# Patient Record
Sex: Male | Born: 1995 | Race: Black or African American | Hispanic: No | Marital: Single | Smoking: Current some day smoker
Health system: Southern US, Community
[De-identification: ages and names within clinical notes are randomized; demographics above are authoritative.]

## PROBLEM LIST (undated history)

## (undated) DIAGNOSIS — R569 Unspecified convulsions: Secondary | ICD-10-CM

## (undated) DIAGNOSIS — S82409A Unspecified fracture of shaft of unspecified fibula, initial encounter for closed fracture: Secondary | ICD-10-CM

## (undated) HISTORY — DX: Unspecified fracture of shaft of unspecified fibula, initial encounter for closed fracture: S82.409A

---
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
## 2016-08-13 DIAGNOSIS — J029 Acute pharyngitis, unspecified: Secondary | ICD-10-CM | POA: Diagnosis not present

## 2016-08-13 DIAGNOSIS — J309 Allergic rhinitis, unspecified: Secondary | ICD-10-CM | POA: Diagnosis not present

## 2018-10-10 DIAGNOSIS — H5213 Myopia, bilateral: Secondary | ICD-10-CM | POA: Diagnosis not present

## 2018-10-31 ENCOUNTER — Emergency Department (HOSPITAL_COMMUNITY)
Admission: EM | Admit: 2018-10-31 | Discharge: 2018-10-31 | Disposition: A | Discharge status: Home / Self Care | Payer: Managed Care, Other (non HMO) | Attending: Emergency Medicine | Admitting: Emergency Medicine

## 2018-10-31 ENCOUNTER — Other Ambulatory Visit: Payer: Self-pay

## 2018-10-31 DIAGNOSIS — R52 Pain, unspecified: Secondary | ICD-10-CM | POA: Diagnosis not present

## 2018-10-31 DIAGNOSIS — E876 Hypokalemia: Secondary | ICD-10-CM | POA: Insufficient documentation

## 2018-10-31 DIAGNOSIS — R1084 Generalized abdominal pain: Secondary | ICD-10-CM | POA: Diagnosis not present

## 2018-10-31 DIAGNOSIS — R1111 Vomiting without nausea: Secondary | ICD-10-CM | POA: Diagnosis not present

## 2018-10-31 DIAGNOSIS — Z7722 Contact with and (suspected) exposure to environmental tobacco smoke (acute) (chronic): Secondary | ICD-10-CM | POA: Diagnosis not present

## 2018-10-31 DIAGNOSIS — R112 Nausea with vomiting, unspecified: Secondary | ICD-10-CM | POA: Diagnosis not present

## 2018-10-31 DIAGNOSIS — R1033 Periumbilical pain: Secondary | ICD-10-CM | POA: Diagnosis not present

## 2018-10-31 LAB — CBC WITH DIFFERENTIAL/PLATELET
Abs Immature Granulocytes: 0.02 10*3/uL (ref 0.00–0.07)
Basophils Absolute: 0.1 10*3/uL (ref 0.0–0.1)
Basophils Relative: 1 %
Eosinophils Absolute: 0 10*3/uL (ref 0.0–0.5)
Eosinophils Relative: 0 %
HCT: 43.9 % (ref 39.0–52.0)
Hemoglobin: 14.9 g/dL (ref 13.0–17.0)
Immature Granulocytes: 0 %
Lymphocytes Relative: 9 %
Lymphs Abs: 1.1 10*3/uL (ref 0.7–4.0)
MCH: 30.8 pg (ref 26.0–34.0)
MCHC: 33.9 g/dL (ref 30.0–36.0)
MCV: 90.7 fL (ref 80.0–100.0)
Monocytes Absolute: 0.4 10*3/uL (ref 0.1–1.0)
Monocytes Relative: 4 %
Neutro Abs: 10.5 10*3/uL — ABNORMAL HIGH (ref 1.7–7.7)
Neutrophils Relative %: 86 %
Platelets: 256 10*3/uL (ref 150–400)
RBC: 4.84 MIL/uL (ref 4.22–5.81)
RDW: 11.6 % (ref 11.5–15.5)
WBC: 12.2 10*3/uL — ABNORMAL HIGH (ref 4.0–10.5)
nRBC: 0 % (ref 0.0–0.2)

## 2018-10-31 LAB — COMPREHENSIVE METABOLIC PANEL
ALT: 22 U/L (ref 0–44)
AST: 25 U/L (ref 15–41)
Albumin: 4.5 g/dL (ref 3.5–5.0)
Alkaline Phosphatase: 99 U/L (ref 38–126)
Anion gap: 14 (ref 5–15)
BUN: 12 mg/dL (ref 6–20)
CO2: 21 mmol/L — ABNORMAL LOW (ref 22–32)
Calcium: 9.8 mg/dL (ref 8.9–10.3)
Chloride: 103 mmol/L (ref 98–111)
Creatinine, Ser: 0.99 mg/dL (ref 0.61–1.24)
GFR calc Af Amer: >60 mL/min (ref >60)
GFR calc non Af Amer: >60 mL/min (ref >60)
Glucose, Bld: 126 mg/dL — ABNORMAL HIGH (ref 70–99)
Potassium: 2.9 mmol/L — ABNORMAL LOW (ref 3.5–5.1)
Sodium: 138 mmol/L (ref 135–145)
Total Bilirubin: 2 mg/dL — ABNORMAL HIGH (ref 0.3–1.2)
Total Protein: 7.3 g/dL (ref 6.5–8.1)

## 2018-10-31 LAB — LIPASE, BLOOD: Lipase: 22 U/L (ref 11–51)

## 2018-10-31 MED ORDER — POTASSIUM CHLORIDE CRYS ER 20 MEQ PO TBCR: 20.0000 meq | EXTENDED_RELEASE_TABLET | Freq: Two times a day (BID) | ORAL | 0 refills | Status: DC

## 2018-10-31 MED ORDER — LIDOCAINE VISCOUS HCL 2 % MT SOLN
15.0000 mL | Freq: Once | OROMUCOSAL | Status: AC
  Administered 2018-10-31: 04:00:00 15 mL via ORAL
  Filled 2018-10-31: qty 15

## 2018-10-31 MED ORDER — POTASSIUM CHLORIDE CRYS ER 20 MEQ PO TBCR
40.0000 meq | EXTENDED_RELEASE_TABLET | Freq: Once | ORAL | Status: AC
  Administered 2018-10-31: 40 meq via ORAL
  Filled 2018-10-31: qty 2

## 2018-10-31 MED ORDER — SODIUM CHLORIDE 0.9 % IV BOLUS
1000.0000 mL | Freq: Once | INTRAVENOUS | Status: AC
  Administered 2018-10-31: 04:00:00 1000 mL via INTRAVENOUS

## 2018-10-31 MED ORDER — ALUM & MAG HYDROXIDE-SIMETH 200-200-20 MG/5ML PO SUSP
30.0000 mL | Freq: Once | ORAL | Status: AC
  Administered 2018-10-31: 30 mL via ORAL
  Filled 2018-10-31: qty 30

## 2018-10-31 MED ORDER — MORPHINE SULFATE (PF) 4 MG/ML IV SOLN
4.0000 mg | Freq: Once | INTRAVENOUS | Status: AC
  Administered 2018-10-31: 04:00:00 4 mg via INTRAVENOUS
  Filled 2018-10-31: qty 1

## 2018-10-31 MED ORDER — ONDANSETRON HCL 4 MG PO TABS: 4.0000 mg | ORAL_TABLET | Freq: Four times a day (QID) | ORAL | 0 refills | Status: DC | PRN

## 2018-10-31 MED ORDER — ONDANSETRON HCL 4 MG/2ML IJ SOLN
4.0000 mg | Freq: Once | INTRAMUSCULAR | Status: AC
  Administered 2018-10-31: 4 mg via INTRAVENOUS
  Filled 2018-10-31: qty 2

## 2018-10-31 NOTE — ED Provider Notes (Signed)
[facility] EMERGENCY [facility] Provider Note   CSN: [id number] Arrival date & time: 10/31/18  0321    History   Chief Complaint Chief Complaint  Patient presents with  . Abdominal Pain    HPI Tom Jones is a 23 y.o. male.   The history is provided by the patient.  He had onset about 9 PM of severe periumbilical pain without radiation.  There is associated nausea and vomiting.  He denies constipation or diarrhea.  Pain started after eating some chicken Alfredo.  Pain is rated 10/10.  Nothing makes it better, nothing makes it worse.  He has not had similar symptoms in the past.  Past Medical History:  Diagnosis Date  . Closed fibular fracture 05/07/2012   right distal fib. - sledding accident  . Seizures age 69   febrile seizure x 1    Patient Active Problem List   Diagnosis Date Noted  . Closed fibular fracture   . Concussion without loss of consciousness 12/21/2010    Past Surgical History:  Procedure Laterality Date  . ORIF FIBULA FRACTURE Right 05/19/2012   Procedure: OPEN REDUCTION INTERNAL FIXATION (ORIF) FIBULA FRACTURE;  Surgeon: Nilda Simmerobert A Wainer, MD;  Location: [facility];  Service: Orthopedics;  Laterality: Right;        Home Medications    Prior to Admission medications   Medication Sig Start Date End Date Taking? Authorizing Provider  oxyCODONE-acetaminophen (ROXICET) 5-325 MG per tablet 1-2 tablets every 4-6 hrs as needed for pain 05/19/12   Shepperson, Kirstin, PA-C    Family History Family History  Problem Relation Age of Onset  . Diabetes Maternal Aunt        2 aunts  . Hypertension Maternal Aunt   . Heart disease Maternal Aunt        valve replacement  . Diabetes Maternal Grandmother     Social History Social History   Tobacco Use  . Smoking status: Passive Smoke Exposure - Never Smoker  . Smokeless tobacco: Never Used  . Tobacco comment: mother smokes outside  Substance Use Topics  . Alcohol use: No   . Drug use: No     Allergies   Patient has no known allergies.   Review of Systems Review of Systems  All other systems reviewed and are negative.    Physical Exam Updated Vital Signs BP 123/85   Pulse 74   Temp 98.6 F (37 C) (Oral)   Resp 20   SpO2 100%   Physical Exam Vitals signs and nursing note reviewed.    23 year old male, resting comfortably and in no acute distress. Vital signs are normal. Oxygen saturation is 100%, which is normal. Head is normocephalic and atraumatic. PERRLA, EOMI. Oropharynx is clear. Neck is nontender and supple without adenopathy or JVD. Back is nontender and there is no CVA tenderness. Lungs are clear without rales, wheezes, or rhonchi. Chest is nontender. Heart has regular rate and rhythm without murmur. Abdomen is soft, flat, with moderate periumbilical tenderness.  There is no rebound or guarding.  There are no masses or hepatosplenomegaly and peristalsis is hypoactive. Extremities have no cyanosis or edema, full range of motion is present. Skin is warm and dry without rash. Neurologic: Mental status is normal, cranial nerves are intact, there are no motor or sensory deficits.  ED Treatments / Results  Labs (all labs ordered are listed, but only abnormal results are displayed) Labs Reviewed  COMPREHENSIVE METABOLIC PANEL - Abnormal; Notable for  the following components:      Result Value   Potassium 2.9 (*)    CO2 21 (*)    Glucose, Bld 126 (*)    Total Bilirubin 2.0 (*)    All other components within normal limits  CBC WITH DIFFERENTIAL/PLATELET - Abnormal; Notable for the following components:   WBC 12.2 (*)    Neutro Abs 10.5 (*)    All other components within normal limits  LIPASE, BLOOD  URINALYSIS, ROUTINE W REFLEX MICROSCOPIC   Procedures Procedures   Medications Ordered in ED Medications  sodium chloride 0.9 % bolus 1,000 mL (has no administration in time range)  ondansetron (ZOFRAN) injection 4 mg (has no  administration in time range)  morphine 4 MG/ML injection 4 mg (has no administration in time range)  alum & mag hydroxide-simeth (MAALOX/MYLANTA) 200-200-20 MG/5ML suspension 30 mL (has no administration in time range)    And  lidocaine (XYLOCAINE) 2 % viscous mouth solution 15 mL (has no administration in time range)     Initial Impression / Assessment and Plan / ED Course  I have reviewed the triage vital signs and the nursing notes.  Pertinent labs & imaging results that were available during my care of the patient were reviewed by me and considered in my medical decision making (see chart for details).  Abdominal pain with rather benign abdominal exam.  Doubt serious pathology.  Suspect viral gastritis.  No right upper quadrant pain or Murphy sign to suggest biliary tract disease.  Will check screening labs, give IV fluids, morphine, ondansetron, GI cocktail.  Old records are reviewed, and he has no relevant past visits.  He feels much better after above-noted treatment.  Labs are significant for hypokalemia, presumably secondary to vomiting.  He is given a dose of oral potassium and is discharged with prescriptions for K. Dur and ondansetron.  Return precautions discussed.  Final Clinical Impressions(s) / ED Diagnoses   Final diagnoses:  Periumbilical pain  Non-intractable vomiting with nausea, unspecified vomiting type  Hypokalemia    ED Discharge Orders         Ordered    potassium chloride SA (K-DUR) 20 MEQ tablet  2 times daily     10/31/18 0712    ondansetron (ZOFRAN) 4 MG tablet  Every 6 hours PRN     10/31/18 6010           Delora Fuel, MD 93/23/55 [phone]

## 2018-10-31 NOTE — ED Triage Notes (Signed)
Patient c/o abd pain that began last night about 9pm.. Also c/o n/v and is moaning while triaging

## 2018-10-31 NOTE — Discharge Instructions (Addendum)
Take antacids as needed.  Return if symptoms are getting worse. 

## 2018-10-31 NOTE — ED Notes (Signed)
Pt discharge summary reviewed with pt. Pt verbalized understanding of instructions. Pt unable to sign d/t signature pad not picking up. Pt discharged.

## 2018-10-31 NOTE — ED Notes (Signed)
Pt made aware that we need urine specimen. 

## 2018-11-01 ENCOUNTER — Encounter (HOSPITAL_COMMUNITY): Payer: Self-pay | Admitting: Emergency Medicine

## 2018-11-01 ENCOUNTER — Other Ambulatory Visit: Payer: Self-pay

## 2018-11-01 ENCOUNTER — Emergency Department (HOSPITAL_COMMUNITY)
Admission: EM | Admit: 2018-11-01 | Discharge: 2018-11-01 | Disposition: A | Discharge status: Home / Self Care | Payer: Managed Care, Other (non HMO) | Attending: Emergency Medicine | Admitting: Emergency Medicine

## 2018-11-01 ENCOUNTER — Emergency Department (HOSPITAL_COMMUNITY): Payer: Managed Care, Other (non HMO)

## 2018-11-01 DIAGNOSIS — Z7722 Contact with and (suspected) exposure to environmental tobacco smoke (acute) (chronic): Secondary | ICD-10-CM | POA: Insufficient documentation

## 2018-11-01 DIAGNOSIS — R112 Nausea with vomiting, unspecified: Secondary | ICD-10-CM | POA: Insufficient documentation

## 2018-11-01 DIAGNOSIS — R109 Unspecified abdominal pain: Secondary | ICD-10-CM | POA: Diagnosis present

## 2018-11-01 DIAGNOSIS — R1110 Vomiting, unspecified: Secondary | ICD-10-CM | POA: Diagnosis not present

## 2018-11-01 LAB — URINALYSIS, ROUTINE W REFLEX MICROSCOPIC
Bilirubin Urine: NEGATIVE
Glucose, UA: NEGATIVE mg/dL
Hgb urine dipstick: NEGATIVE
Ketones, ur: 20 mg/dL — AB
Leukocytes,Ua: NEGATIVE
Nitrite: NEGATIVE
Protein, ur: NEGATIVE mg/dL
Specific Gravity, Urine: >1.046 — ABNORMAL HIGH (ref 1.005–1.030)
pH: 7 (ref 5.0–8.0)

## 2018-11-01 LAB — COMPREHENSIVE METABOLIC PANEL
ALT: 21 U/L (ref 0–44)
AST: 18 U/L (ref 15–41)
Albumin: 4.5 g/dL (ref 3.5–5.0)
Alkaline Phosphatase: 94 U/L (ref 38–126)
Anion gap: 12 (ref 5–15)
BUN: 9 mg/dL (ref 6–20)
CO2: 24 mmol/L (ref 22–32)
Calcium: 9.8 mg/dL (ref 8.9–10.3)
Chloride: 102 mmol/L (ref 98–111)
Creatinine, Ser: 0.97 mg/dL (ref 0.61–1.24)
GFR calc Af Amer: >60 mL/min (ref >60)
GFR calc non Af Amer: >60 mL/min (ref >60)
Glucose, Bld: 117 mg/dL — ABNORMAL HIGH (ref 70–99)
Potassium: 3.9 mmol/L (ref 3.5–5.1)
Sodium: 138 mmol/L (ref 135–145)
Total Bilirubin: 2.6 mg/dL — ABNORMAL HIGH (ref 0.3–1.2)
Total Protein: 7.7 g/dL (ref 6.5–8.1)

## 2018-11-01 LAB — CBC
HCT: 45.2 % (ref 39.0–52.0)
Hemoglobin: 15.1 g/dL (ref 13.0–17.0)
MCH: 30.6 pg (ref 26.0–34.0)
MCHC: 33.4 g/dL (ref 30.0–36.0)
MCV: 91.5 fL (ref 80.0–100.0)
Platelets: 250 10*3/uL (ref 150–400)
RBC: 4.94 MIL/uL (ref 4.22–5.81)
RDW: 11.9 % (ref 11.5–15.5)
WBC: 13.5 10*3/uL — ABNORMAL HIGH (ref 4.0–10.5)
nRBC: 0 % (ref 0.0–0.2)

## 2018-11-01 LAB — LACTIC ACID, PLASMA: Lactic Acid, Venous: 1.3 mmol/L (ref 0.5–1.9)

## 2018-11-01 LAB — LIPASE, BLOOD: Lipase: 42 U/L (ref 11–51)

## 2018-11-01 MED ORDER — ONDANSETRON 4 MG PO TBDP: 4.0000 mg | ORAL_TABLET | Freq: Three times a day (TID) | ORAL | 0 refills | Status: DC | PRN

## 2018-11-01 MED ORDER — METOCLOPRAMIDE HCL 5 MG/ML IJ SOLN
10.0000 mg | Freq: Once | INTRAMUSCULAR | Status: AC
  Administered 2018-11-01: 10 mg via INTRAVENOUS
  Filled 2018-11-01: qty 2

## 2018-11-01 MED ORDER — SODIUM CHLORIDE 0.9% FLUSH
3.0000 mL | Freq: Once | INTRAVENOUS | Status: AC
  Administered 2018-11-01: 3 mL via INTRAVENOUS

## 2018-11-01 MED ORDER — HALOPERIDOL LACTATE 5 MG/ML IJ SOLN
5.0000 mg | Freq: Once | INTRAMUSCULAR | Status: AC
  Administered 2018-11-01: 5 mg via INTRAVENOUS
  Filled 2018-11-01: qty 1

## 2018-11-01 MED ORDER — IOHEXOL 300 MG/ML  SOLN
100.0000 mL | Freq: Once | INTRAMUSCULAR | Status: AC | PRN
  Administered 2018-11-01: 100 mL via INTRAVENOUS

## 2018-11-01 MED ORDER — ONDANSETRON 4 MG PO TBDP: 4.0000 mg | ORAL_TABLET | Freq: Once | ORAL | Status: DC | PRN

## 2018-11-01 MED ORDER — MORPHINE SULFATE (PF) 4 MG/ML IV SOLN
4.0000 mg | Freq: Once | INTRAVENOUS | Status: AC
  Administered 2018-11-01: 4 mg via INTRAVENOUS
  Filled 2018-11-01: qty 1

## 2018-11-01 MED ORDER — ONDANSETRON HCL 4 MG/2ML IJ SOLN
4.0000 mg | Freq: Once | INTRAMUSCULAR | Status: AC
  Administered 2018-11-01: 4 mg via INTRAVENOUS
  Filled 2018-11-01: qty 2

## 2018-11-01 MED ORDER — LACTATED RINGERS IV BOLUS
1000.0000 mL | Freq: Once | INTRAVENOUS | Status: AC
  Administered 2018-11-01: 1000 mL via INTRAVENOUS

## 2018-11-01 NOTE — ED Notes (Signed)
Pt is doing better according to the pts mother  hes sleeping

## 2018-11-01 NOTE — ED Notes (Signed)
Pt provided with urinal, attempted to void, however unable to at this time.  IV fluid bolus administered, will re-attempt  to obtain urine sample when fluids are completed.

## 2018-11-01 NOTE — ED Notes (Signed)
Patient transported to CT 

## 2018-11-01 NOTE — ED Provider Notes (Signed)
[facility] EMERGENCY DEPARTMENT Provider Note   CSN: [id number] Arrival date & time: 11/01/18  [id number]     History   Chief Complaint Chief Complaint  Patient presents with  . Emesis    HPI Tom Jones is a 23 y.o. male.     Patient is a 23 year old healthy male with no significant medical problems returning today for persistent abdominal pain and vomiting.  Patient was seen yesterday for similar symptoms and had labs that showed a leukocytosis of 12,000 and mild hypokalemia.  He was feeling better after morphine and GI cocktail and was discharged home with Zofran and potassium.  However throughout the night patient has continued to vomit and mom states now the emesis appears to be green.  He is still not able to hold anything down and rates his pain is 9 out of 10 throughout the entire abdomen.  He states trying to take the medications makes the pain worse.  He denies any NSAID use, alcohol use, drug use.  He takes no medications regularly.  He denies any urinary symptoms, cough, shortness of breath or fever.  The history is provided by the patient.  Emesis Severity:  Moderate Duration:  2 days Timing:  Constant Number of daily episodes:  Multiple Quality:  Bilious material and stomach contents Progression:  Worsening Chronicity:  New Recent urination:  Decreased Relieved by:  Nothing Worsened by:  Antiemetics and food smell Ineffective treatments:  Antiemetics Associated symptoms: abdominal pain   Associated symptoms: no diarrhea, no fever, no sore throat and no URI   Risk factors: no alcohol use, no prior abdominal surgery, no sick contacts, no suspect food intake and no travel to endemic areas     Past Medical History:  Diagnosis Date  . Closed fibular fracture 05/07/2012   right distal fib. - sledding accident  . Seizures [facility](HCC) age 51   febrile seizure x 1    Patient Active Problem List   Diagnosis Date Noted  . Closed fibular fracture   .  Concussion without loss of consciousness 12/21/2010    Past Surgical History:  Procedure Laterality Date  . ORIF FIBULA FRACTURE Right 05/19/2012   Procedure: OPEN REDUCTION INTERNAL FIXATION (ORIF) FIBULA FRACTURE;  Surgeon: Nilda Simmerobert A Wainer, MD;  Location: [facility];  Service: Orthopedics;  Laterality: Right;        Home Medications    Prior to Admission medications   Medication Sig Start Date End Date Taking? Authorizing Provider  ondansetron (ZOFRAN) 4 MG tablet Take 1 tablet (4 mg total) by mouth every 6 (six) hours as needed for nausea or vomiting. 10/31/18   Dione BoozeGlick, David, MD  potassium chloride SA (K-DUR) 20 MEQ tablet Take 1 tablet (20 mEq total) by mouth 2 (two) times daily. 10/31/18   Dione BoozeGlick, David, MD    Family History Family History  Problem Relation Age of Onset  . Diabetes Maternal Aunt        2 aunts  . Hypertension Maternal Aunt   . Heart disease Maternal Aunt        valve replacement  . Diabetes Maternal Grandmother     Social History Social History   Tobacco Use  . Smoking status: Passive Smoke Exposure - Never Smoker  . Smokeless tobacco: Never Used  . Tobacco comment: mother smokes outside  Substance Use Topics  . Alcohol use: No  . Drug use: No     Allergies   Patient has no known allergies.  Review of Systems Review of Systems  Constitutional: Negative for fever.  HENT: Negative for sore throat.   Gastrointestinal: Positive for abdominal pain and vomiting. Negative for diarrhea.  All other systems reviewed and are negative.    Physical Exam Updated Vital Signs BP (!) 143/92 (BP Location: Right Arm)   Pulse 64   Temp 99 F (37.2 C) (Oral)   Resp 20   SpO2 100%   Physical Exam Vitals signs and nursing note reviewed.  Constitutional:      General: He is in acute distress.     Appearance: He is well-developed and normal weight.     Comments: Appears uncomfortable  HENT:     Head: Normocephalic and atraumatic.      Nose: Nose normal.     Mouth/Throat:     Mouth: Mucous membranes are dry.  Eyes:     Conjunctiva/sclera: Conjunctivae normal.     Pupils: Pupils are equal, round, and reactive to light.  Neck:     Musculoskeletal: Normal range of motion and neck supple.  Cardiovascular:     Rate and Rhythm: Normal rate and regular rhythm.     Pulses: Normal pulses.     Heart sounds: No murmur.  Pulmonary:     Effort: Pulmonary effort is normal. No respiratory distress.     Breath sounds: Normal breath sounds. No wheezing or rales.  Abdominal:     General: Abdomen is flat. There is no distension.     Palpations: Abdomen is soft.     Tenderness: There is abdominal tenderness. There is guarding. There is no rebound.     Comments: Generalized abdominal pain but more pronounced in the right lower quadrant  Musculoskeletal: Normal range of motion.        General: No tenderness.  Skin:    General: Skin is warm and dry.     Findings: No erythema or rash.  Neurological:     General: No focal deficit present.     Mental Status: He is alert and oriented to person, place, and time. Mental status is at baseline.  Psychiatric:        Mood and Affect: Mood normal.        Behavior: Behavior normal.        Thought Content: Thought content normal.      ED Treatments / Results  Labs (all labs ordered are listed, but only abnormal results are displayed) Labs Reviewed  COMPREHENSIVE METABOLIC PANEL - Abnormal; Notable for the following components:      Result Value   Glucose, Bld 117 (*)    Total Bilirubin 2.6 (*)    All other components within normal limits  CBC - Abnormal; Notable for the following components:   WBC 13.5 (*)    All other components within normal limits  LIPASE, BLOOD  LACTIC ACID, PLASMA  URINALYSIS, ROUTINE W REFLEX MICROSCOPIC    EKG EKG Interpretation  Date/Time:  Saturday November 01 2018 11:44:38 EDT Ventricular Rate:  47 PR Interval:    QRS Duration: 101 QT Interval:  451  QTC Calculation: 399 R Axis:   97 Text Interpretation:  Age not entered, assumed to be  23 years old for purpose of ECG interpretation Sinus bradycardia Left ventricular hypertrophy Inferior infarct, acute (LCx) ST elevation, consider anterior injury Lateral leads are also involved Baseline wander in lead(s) V4 V5 Anterolateral ST elevation, probable early repolarization Confirmed by Blanchie Dessert [phone]) on 11/01/2018 12:21:41 PM   Radiology Ct Abdomen Pelvis W  Contrast  Result Date: 11/01/2018 CLINICAL DATA:  23 year old male with acute abdominal and pelvic pain with nausea and vomiting today. EXAM: CT ABDOMEN AND PELVIS WITH CONTRAST TECHNIQUE: Multidetector CT imaging of the abdomen and pelvis was performed using the standard protocol following bolus administration of intravenous contrast. CONTRAST:  100mL OMNIPAQUE IOHEXOL 300 MG/ML  SOLN COMPARISON:  None. FINDINGS: Lower chest: Unremarkable. Hepatobiliary: No significant hepatic or gallbladder abnormalities. No biliary dilatation. Pancreas: Unremarkable Spleen: Unremarkable Adrenals/Urinary Tract: The kidneys, adrenal glands and bladder are unremarkable. Stomach/Bowel: High density material within the RIGHT colon and stomach noted-question medications versus less likely ingested non food foreign material. No bowel dilatation, evidence of bowel obstruction, bowel wall thickening or inflammation noted. The appendix is normal. Vascular/Lymphatic: No significant vascular findings are present. No enlarged abdominal or pelvic lymph nodes. Reproductive: Prostate is unremarkable. Other: No ascites, focal collection or pneumoperitoneum. No abdominal wall hernia. Musculoskeletal: No acute or suspicious bony abnormalities. IMPRESSION: 1. No evidence of acute abnormality. 2. High density material within the RIGHT colon and stomach-favor medications versus less likely ingested non food foreign material. Correlate clinically. Electronically Signed   By: Harmon PierJeffrey   Hu M.D.   On: 11/01/2018 11:48    Procedures Procedures (including critical care time)  Medications Ordered in ED Medications  sodium chloride flush (NS) 0.9 % injection 3 mL (has no administration in time range)  lactated ringers bolus 1,000 mL (has no administration in time range)  ondansetron (ZOFRAN) injection 4 mg (has no administration in time range)  morphine 4 MG/ML injection 4 mg (has no administration in time range)     Initial Impression / Assessment and Plan / ED Course  I have reviewed the triage vital signs and the nursing notes.  Pertinent labs & imaging results that were available during my care of the patient were reviewed by me and considered in my medical decision making (see chart for details).       Patient returning today with persistent vomiting and diffuse abdominal pain.  Patient seen yesterday and discharged with similar symptoms but feeling better after supportive care.  Patient has diffuse abdominal pain on my exam with guarding but more severe in the right lower quadrant.  Patient had mild leukocytosis of 12 yesterday and CMP with mild hypokalemia.  Patient has continued to vomit throughout the night.  Patient given nausea and pain control.  Repeat labs and CT pending.  Currently hemodynamically stable.  Concern for appendicitis versus obstruction versus colitis versus perforation.  3:21 PM Patient has persistent mild leukocytosis of 13,000, CMP without acute findings and potassium is now normal.  Lipase is still within normal limits.  CT shows no evidence of acute abnormality but possible medication versus non-food foreign material in the colon without evidence of obstruction.  On repeat evaluation patient is still having discomfort and vomiting.  Speaking with the patient's mother she states he does smoke marijuana regularly.  Concern for possible hyperemesis syndrome from cannabis.  Patient will be given Haldol as Reglan and Zofran have not been helpful.   Final Clinical Impressions(s) / ED Diagnoses   Final diagnoses:  None    ED Discharge Orders    None       Gwyneth SproutPlunkett, Huntley Demedeiros, MD 11/01/18 [phone]

## 2018-11-01 NOTE — Discharge Instructions (Signed)
Not smoking marijuana may help with the vomiting.  Try and keep yourself hydrated.

## 2018-11-01 NOTE — ED Triage Notes (Addendum)
Pt reports continued abd pain with n/v, denies diarrhea, fever, chills.

## 2018-11-02 ENCOUNTER — Other Ambulatory Visit: Payer: Self-pay

## 2018-11-02 ENCOUNTER — Encounter (HOSPITAL_COMMUNITY): Payer: Self-pay | Admitting: Emergency Medicine

## 2018-11-02 ENCOUNTER — Emergency Department (HOSPITAL_COMMUNITY)
Admission: EM | Admit: 2018-11-02 | Discharge: 2018-11-02 | Discharge status: Against Medical Advice | Payer: Managed Care, Other (non HMO) | Attending: Emergency Medicine | Admitting: Emergency Medicine

## 2018-11-02 DIAGNOSIS — R109 Unspecified abdominal pain: Secondary | ICD-10-CM | POA: Diagnosis not present

## 2018-11-02 DIAGNOSIS — Z5321 Procedure and treatment not carried out due to patient leaving prior to being seen by health care provider: Secondary | ICD-10-CM | POA: Diagnosis not present

## 2018-11-02 DIAGNOSIS — R112 Nausea with vomiting, unspecified: Secondary | ICD-10-CM | POA: Diagnosis present

## 2018-11-02 LAB — CBC
HCT: 45.3 % (ref 39.0–52.0)
Hemoglobin: 15 g/dL (ref 13.0–17.0)
MCH: 30.7 pg (ref 26.0–34.0)
MCHC: 33.1 g/dL (ref 30.0–36.0)
MCV: 92.8 fL (ref 80.0–100.0)
Platelets: 249 10*3/uL (ref 150–400)
RBC: 4.88 MIL/uL (ref 4.22–5.81)
RDW: 11.7 % (ref 11.5–15.5)
WBC: 11.9 10*3/uL — ABNORMAL HIGH (ref 4.0–10.5)
nRBC: 0 % (ref 0.0–0.2)

## 2018-11-02 LAB — COMPREHENSIVE METABOLIC PANEL
ALT: 22 U/L (ref 0–44)
AST: 19 U/L (ref 15–41)
Albumin: 4.3 g/dL (ref 3.5–5.0)
Alkaline Phosphatase: 97 U/L (ref 38–126)
Anion gap: 13 (ref 5–15)
BUN: 10 mg/dL (ref 6–20)
CO2: 24 mmol/L (ref 22–32)
Calcium: 9.4 mg/dL (ref 8.9–10.3)
Chloride: 98 mmol/L (ref 98–111)
Creatinine, Ser: 0.89 mg/dL (ref 0.61–1.24)
GFR calc Af Amer: >60 mL/min (ref >60)
GFR calc non Af Amer: >60 mL/min (ref >60)
Glucose, Bld: 107 mg/dL — ABNORMAL HIGH (ref 70–99)
Potassium: 3.6 mmol/L (ref 3.5–5.1)
Sodium: 135 mmol/L (ref 135–145)
Total Bilirubin: 3.8 mg/dL — ABNORMAL HIGH (ref 0.3–1.2)
Total Protein: 7.3 g/dL (ref 6.5–8.1)

## 2018-11-02 LAB — LIPASE, BLOOD: Lipase: 38 U/L (ref 11–51)

## 2018-11-02 MED ORDER — SODIUM CHLORIDE 0.9% FLUSH: 3.0000 mL | Freq: Once | INTRAVENOUS | Status: DC

## 2018-11-02 NOTE — ED Notes (Signed)
NO ANSWER X 3 in lobby

## 2018-11-02 NOTE — ED Notes (Signed)
Unable to find pt in waiting room or triage rooms.

## 2018-11-02 NOTE — ED Triage Notes (Signed)
Pt c/o nausea/vomiting/abdominal pain x 3 days. Seen yesterday for the same.

## 2018-11-04 ENCOUNTER — Ambulatory Visit (INDEPENDENT_AMBULATORY_CARE_PROVIDER_SITE_OTHER): Payer: Managed Care, Other (non HMO) | Admitting: Nurse Practitioner

## 2018-11-04 ENCOUNTER — Encounter: Payer: Self-pay | Admitting: Nurse Practitioner

## 2018-11-04 ENCOUNTER — Other Ambulatory Visit: Payer: Self-pay

## 2018-11-04 VITALS — BP 138/98 | HR 98 | Temp 98.4°F | Ht 67.0 in | Wt 144.0 lb

## 2018-11-04 DIAGNOSIS — F1290 Cannabis use, unspecified, uncomplicated: Secondary | ICD-10-CM | POA: Diagnosis not present

## 2018-11-04 DIAGNOSIS — R1110 Vomiting, unspecified: Secondary | ICD-10-CM | POA: Insufficient documentation

## 2018-11-04 DIAGNOSIS — Z87898 Personal history of other specified conditions: Secondary | ICD-10-CM | POA: Insufficient documentation

## 2018-11-04 DIAGNOSIS — F1291 Cannabis use, unspecified, in remission: Secondary | ICD-10-CM | POA: Insufficient documentation

## 2018-11-04 DIAGNOSIS — R112 Nausea with vomiting, unspecified: Secondary | ICD-10-CM | POA: Diagnosis not present

## 2018-11-04 MED ORDER — PROMETHAZINE HCL 25 MG/ML IJ SOLN
25.0000 mg | Freq: Once | INTRAMUSCULAR | Status: AC
  Administered 2018-11-04: 25 mg via INTRAMUSCULAR

## 2018-11-04 MED ORDER — AMITRIPTYLINE HCL 25 MG PO TABS: 25.0000 mg | ORAL_TABLET | Freq: Every day | ORAL | 1 refills | Status: DC

## 2018-11-04 MED ORDER — PROMETHAZINE HCL 25 MG PO TABS: 25.0000 mg | ORAL_TABLET | Freq: Once | ORAL | Status: DC

## 2018-11-04 NOTE — Patient Instructions (Signed)
Get capsacin cream to rub on your stomach as needed for pain

## 2018-11-04 NOTE — Progress Notes (Signed)
Subjective:     Patient ID: Tom Jones , male    DOB: 01/23/1996 , 23 y.o.   MRN: [id number]   Chief Complaint  Patient presents with  . Abdominal Pain    patient stated his stomach has been hurting and he has been vomitting. patient denies if he has had a fever     HPI  Smokes marijuana daily at least two times a day.  It has been 6 days.  Ordered pasta and cereal then vomited.  He has vomited 12 times in the last 24 hours. Has tried to eat fruits and liquids.  Abdominal pain is "severe".     Abdominal Pain This is a new problem. The current episode started more than 1 year ago. The onset quality is sudden. Pertinent negatives include no dysuria.     Past Medical History:  Diagnosis Date  . Closed fibular fracture 05/07/2012   right distal fib. - sledding accident  . Seizures [facility](HCC) age 6   febrile seizure x 1     Family History  Problem Relation Age of Onset  . Diabetes Maternal Aunt        2 aunts  . Hypertension Maternal Aunt   . Heart disease Maternal Aunt        valve replacement  . Diabetes Maternal Grandmother      Current Outpatient Medications:  .  ondansetron (ZOFRAN) 4 MG tablet, Take 1 tablet (4 mg total) by mouth every 6 (six) hours as needed for nausea or vomiting., Disp: 12 tablet, Rfl: 0 .  potassium chloride SA (K-DUR) 20 MEQ tablet, Take 1 tablet (20 mEq total) by mouth 2 (two) times daily., Disp: 10 tablet, Rfl: 0   No Known Allergies   Review of Systems  Constitutional: Negative.   Respiratory: Negative.   Cardiovascular: Negative.  Negative for chest pain, palpitations and leg swelling.  Gastrointestinal: Positive for abdominal pain.  Genitourinary: Negative for decreased urine volume and dysuria.  Psychiatric/Behavioral: Negative.     Today's Vitals   11/04/18 1213  BP: (!) 138/98  Pulse: 98  Temp: 98.4 F (36.9 C)  TempSrc: Oral  Weight: 144 lb (65.3 kg)  Height: 5\' 7"  (1.702 m)  PainSc: 7   PainLoc: Abdomen   Body mass index is  22.55 kg/m.   Objective:  Physical Exam Abdominal:     General: Abdomen is flat. Bowel sounds are normal. There is no distension or abdominal bruit.     Palpations: Abdomen is soft.     Tenderness: There is generalized abdominal tenderness (mild tenderness).  Skin:    General: Skin is warm and dry.  Neurological:     General: No focal deficit present.     Mental Status: He is alert.  Psychiatric:        Mood and Affect: Mood normal.        Behavior: Behavior normal.         Assessment And Plan:     1. Intractable vomiting with nausea, unspecified vomiting type  He has continued to have intermittent vomiting feeling a little better today  This is thought to be related to his frequent use of marijuana twice a day  Will treat with promethazine in office to see if effective  He is to return call to office if he is not better  He is also advised to continue drinking clear liquids  CT scan was mostly benign with indication of high density material within the right colon favoring medications  -  amitriptyline (ELAVIL) 25 MG tablet; Take 1 tablet (25 mg total) by mouth at bedtime.  Dispense: 30 tablet; Refill: 1 - promethazine (PHENERGAN) injection 25 mg  2. History of marijuana use  Frequent use two times a day  It is felt his hyperemesis is related to overuse with marijuana.  - promethazine (PHENERGAN) injection 25 mg   Minette Brine, FNP    THE PATIENT IS ENCOURAGED TO PRACTICE SOCIAL DISTANCING DUE TO THE COVID-19 PANDEMIC.

## 2018-11-12 ENCOUNTER — Emergency Department (HOSPITAL_COMMUNITY): Payer: Managed Care, Other (non HMO)

## 2018-11-12 ENCOUNTER — Encounter (HOSPITAL_COMMUNITY): Payer: Self-pay | Admitting: Emergency Medicine

## 2018-11-12 ENCOUNTER — Emergency Department (HOSPITAL_COMMUNITY)
Admission: EM | Admit: 2018-11-12 | Discharge: 2018-11-12 | Disposition: A | Discharge status: Home / Self Care | Payer: Managed Care, Other (non HMO) | Attending: Emergency Medicine | Admitting: Emergency Medicine

## 2018-11-12 ENCOUNTER — Encounter: Payer: Self-pay | Admitting: Physician Assistant

## 2018-11-12 ENCOUNTER — Other Ambulatory Visit: Payer: Self-pay

## 2018-11-12 DIAGNOSIS — Z7722 Contact with and (suspected) exposure to environmental tobacco smoke (acute) (chronic): Secondary | ICD-10-CM | POA: Insufficient documentation

## 2018-11-12 DIAGNOSIS — R109 Unspecified abdominal pain: Secondary | ICD-10-CM

## 2018-11-12 DIAGNOSIS — R1115 Cyclical vomiting syndrome unrelated to migraine: Secondary | ICD-10-CM

## 2018-11-12 DIAGNOSIS — Z79899 Other long term (current) drug therapy: Secondary | ICD-10-CM | POA: Insufficient documentation

## 2018-11-12 DIAGNOSIS — R112 Nausea with vomiting, unspecified: Secondary | ICD-10-CM | POA: Diagnosis present

## 2018-11-12 DIAGNOSIS — G43A1 Cyclical vomiting, intractable: Secondary | ICD-10-CM | POA: Diagnosis not present

## 2018-11-12 DIAGNOSIS — R1084 Generalized abdominal pain: Secondary | ICD-10-CM | POA: Insufficient documentation

## 2018-11-12 DIAGNOSIS — R1110 Vomiting, unspecified: Secondary | ICD-10-CM | POA: Diagnosis not present

## 2018-11-12 LAB — CBC WITH DIFFERENTIAL/PLATELET
Abs Immature Granulocytes: 0.02 10*3/uL (ref 0.00–0.07)
Basophils Absolute: 0.1 10*3/uL (ref 0.0–0.1)
Basophils Relative: 2 %
Eosinophils Absolute: 0 10*3/uL (ref 0.0–0.5)
Eosinophils Relative: 0 %
HCT: 41.1 % (ref 39.0–52.0)
Hemoglobin: 14.4 g/dL (ref 13.0–17.0)
Immature Granulocytes: 0 %
Lymphocytes Relative: 17 %
Lymphs Abs: 1.6 10*3/uL (ref 0.7–4.0)
MCH: 31 pg (ref 26.0–34.0)
MCHC: 35 g/dL (ref 30.0–36.0)
MCV: 88.4 fL (ref 80.0–100.0)
Monocytes Absolute: 0.6 10*3/uL (ref 0.1–1.0)
Monocytes Relative: 6 %
Neutro Abs: 7.3 10*3/uL (ref 1.7–7.7)
Neutrophils Relative %: 75 %
Platelets: 279 10*3/uL (ref 150–400)
RBC: 4.65 MIL/uL (ref 4.22–5.81)
RDW: 11.4 % — ABNORMAL LOW (ref 11.5–15.5)
WBC: 9.7 10*3/uL (ref 4.0–10.5)
nRBC: 0 % (ref 0.0–0.2)

## 2018-11-12 LAB — COMPREHENSIVE METABOLIC PANEL
ALT: 37 U/L (ref 0–44)
AST: 35 U/L (ref 15–41)
Albumin: 4 g/dL (ref 3.5–5.0)
Alkaline Phosphatase: 80 U/L (ref 38–126)
Anion gap: 10 (ref 5–15)
BUN: 9 mg/dL (ref 6–20)
CO2: 26 mmol/L (ref 22–32)
Calcium: 9.5 mg/dL (ref 8.9–10.3)
Chloride: 101 mmol/L (ref 98–111)
Creatinine, Ser: 0.94 mg/dL (ref 0.61–1.24)
GFR calc Af Amer: >60 mL/min (ref >60)
GFR calc non Af Amer: >60 mL/min (ref >60)
Glucose, Bld: 118 mg/dL — ABNORMAL HIGH (ref 70–99)
Potassium: 3.5 mmol/L (ref 3.5–5.1)
Sodium: 137 mmol/L (ref 135–145)
Total Bilirubin: 2.3 mg/dL — ABNORMAL HIGH (ref 0.3–1.2)
Total Protein: 6.7 g/dL (ref 6.5–8.1)

## 2018-11-12 LAB — URINALYSIS, ROUTINE W REFLEX MICROSCOPIC
Bilirubin Urine: NEGATIVE
Glucose, UA: NEGATIVE mg/dL
Hgb urine dipstick: NEGATIVE
Ketones, ur: 20 mg/dL — AB
Leukocytes,Ua: NEGATIVE
Nitrite: NEGATIVE
Protein, ur: NEGATIVE mg/dL
Specific Gravity, Urine: >1.046 — ABNORMAL HIGH (ref 1.005–1.030)
pH: 8 (ref 5.0–8.0)

## 2018-11-12 LAB — RAPID URINE DRUG SCREEN, HOSP PERFORMED
Amphetamines: NOT DETECTED
Barbiturates: NOT DETECTED
Benzodiazepines: NOT DETECTED
Cocaine: NOT DETECTED
Opiates: POSITIVE — AB
Tetrahydrocannabinol: POSITIVE — AB

## 2018-11-12 LAB — LACTIC ACID, PLASMA: Lactic Acid, Venous: 1.6 mmol/L (ref 0.5–1.9)

## 2018-11-12 MED ORDER — SODIUM CHLORIDE 0.9 % IV BOLUS
1000.0000 mL | Freq: Once | INTRAVENOUS | Status: AC
  Administered 2018-11-12: 09:00:00 1000 mL via INTRAVENOUS

## 2018-11-12 MED ORDER — SODIUM CHLORIDE 0.9 % IV BOLUS
1000.0000 mL | Freq: Once | INTRAVENOUS | Status: AC
  Administered 2018-11-12: 1000 mL via INTRAVENOUS

## 2018-11-12 MED ORDER — MORPHINE SULFATE (PF) 4 MG/ML IV SOLN
4.0000 mg | Freq: Once | INTRAVENOUS | Status: AC
  Administered 2018-11-12: 07:00:00 4 mg via INTRAVENOUS
  Filled 2018-11-12 (×2): qty 1

## 2018-11-12 MED ORDER — METOCLOPRAMIDE HCL 10 MG PO TABS: 10.0000 mg | ORAL_TABLET | Freq: Four times a day (QID) | ORAL | 0 refills | Status: DC

## 2018-11-12 MED ORDER — ONDANSETRON HCL 4 MG/2ML IJ SOLN
4.0000 mg | Freq: Once | INTRAMUSCULAR | Status: AC
  Administered 2018-11-12: 06:00:00 4 mg via INTRAVENOUS
  Filled 2018-11-12: qty 2

## 2018-11-12 MED ORDER — DICYCLOMINE HCL 20 MG PO TABS: 20.0000 mg | ORAL_TABLET | Freq: Two times a day (BID) | ORAL | 0 refills | Status: DC

## 2018-11-12 MED ORDER — OMEPRAZOLE 20 MG PO CPDR: 20.0000 mg | DELAYED_RELEASE_CAPSULE | Freq: Every day | ORAL | 0 refills | Status: DC

## 2018-11-12 MED ORDER — SODIUM CHLORIDE 0.9 % IV BOLUS
1000.0000 mL | Freq: Once | INTRAVENOUS | Status: AC
  Administered 2018-11-12: 12:00:00 1000 mL via INTRAVENOUS

## 2018-11-12 MED ORDER — SUCRALFATE 1 G PO TABS: 1.0000 g | ORAL_TABLET | Freq: Three times a day (TID) | ORAL | 1 refills | Status: DC

## 2018-11-12 MED ORDER — HALOPERIDOL LACTATE 5 MG/ML IJ SOLN
2.0000 mg | Freq: Once | INTRAMUSCULAR | Status: AC
  Administered 2018-11-12: 2 mg via INTRAVENOUS
  Filled 2018-11-12: qty 1

## 2018-11-12 MED ORDER — IOHEXOL 300 MG/ML  SOLN
100.0000 mL | Freq: Once | INTRAMUSCULAR | Status: AC
  Administered 2018-11-12: 80 mL via INTRAVENOUS

## 2018-11-12 NOTE — Discharge Planning (Signed)
EDCM approached by pt mom to assist with getting discharge paperwork for pt.  EDCM confirmed with EDP that pt was ready to be discharged and notified EDRN that pt ride was available.  No further EDCM needs identified.

## 2018-11-12 NOTE — ED Triage Notes (Signed)
Patient with nausea and vomiting for the last two days.  Patient has not been able to keep anything down.  Patient seen for the same a week ago.  Patient has not had a BM in two days.  He states that he has had an enema at home with results.

## 2018-11-12 NOTE — ED Notes (Signed)
Patient verbalizes understanding of discharge instructions. Opportunity for questioning and answers were provided. Armband removed by staff, pt discharged from ED.  

## 2018-11-12 NOTE — Discharge Instructions (Signed)
Please read and follow all provided instructions.  Your diagnoses today include:  1. Cyclical vomiting   2. Generalized abdominal pain   3. Abdominal cramping     Tests performed today include:  Blood counts and electrolytes  Blood tests to check liver and kidney function  Blood tests to check pancreas function  Urine test to look for infection - shows dehydration  CT scan - no new changes or problems  Vital signs. See below for your results today.   Medications prescribed:   Reglan - for nausea and vomiting, also might help the stomach empty   Carafate - for stomach upset and to protect your stomach   Bentyl - medication for intestinal cramps and spasms   Omeprazole (Prilosec) - stomach acid reducer  Take any prescribed medications only as directed.  Home care instructions:   Follow any educational materials contained in this packet.   Avoid marijuana.  Please keep your appointment with Dr. Carlean Purl in the next 2 weeks.  Keep drinking plenty of fluids and use the medicine for nausea as directed.    Drink clear liquids for the next 24-48 hours and introduce solid foods slowly after 24 hours using the b.r.a.t. diet (Bananas, Rice, Applesauce, Toast, Yogurt).    Follow-up instructions: Please follow-up with your primary care provider ifor further evaluation of your symptoms. Keep your appointment with GI even if you are feeling better.    Return instructions:  SEEK IMMEDIATE MEDICAL ATTENTION IF:  If you have pain that does not go away or becomes severe   A temperature above 101F develops   Repeated vomiting occurs (multiple episodes)   If you have pain that becomes localized to portions of the abdomen. The right side could possibly be appendicitis. In an adult, the left lower portion of the abdomen could be colitis or diverticulitis.   Blood is being passed in stools or vomit (bright red or black tarry stools)   You develop chest pain, difficulty  breathing, dizziness or fainting, or become confused, poorly responsive, or inconsolable (young children)  If you have any other emergent concerns regarding your health  Additional Information: Abdominal (belly) pain can be caused by many things. Your caregiver performed an examination and possibly ordered blood/urine tests and imaging (CT scan, x-rays, ultrasound). Many cases can be observed and treated at home after initial evaluation in the emergency department. Even though you are being discharged home, abdominal pain can be unpredictable. Therefore, you need a repeated exam if your pain does not resolve, returns, or worsens. Most patients with abdominal pain don't have to be admitted to the hospital or have surgery, but serious problems like appendicitis and gallbladder attacks can start out as nonspecific pain. Many abdominal conditions cannot be diagnosed in one visit, so follow-up evaluations are very important.  Your vital signs today were: BP 114/83 (BP Location: Right Arm)    Pulse 96    Temp 99.1 F (37.3 C)    Resp 17    SpO2 99%  If your blood pressure (bp) was elevated above 135/85 this visit, please have this repeated by your doctor within one month. --------------

## 2018-11-12 NOTE — ED Provider Notes (Signed)
[facility] Provider Note   CSN: [id number] Arrival date & time: 11/12/18  0524     History   Chief Complaint Chief Complaint  Patient presents with  . Emesis    HPI Tom Jones is a 23 y.o. male.     Patient presents to the emergency department with a chief complaint of abdominal pain.  He reports having been in pain for the past 2 weeks.  Reports persistent nausea and vomiting.  Was seen here approximately 1 week ago and had a CT scan performed.  CT showed high-density material, thought to have been medications, less likely non-organic substance.  Patient declines ingesting anything other than food.  He denies putting anything up his rectum.  He states that he has not been able to have a bowel movement in the past 2 weeks.  He continues to have nausea and vomiting.  He also has severe pain.  He reports having had elevated temperature to 100 degrees.  He has tried using enemas with no relief.  The history is provided by the patient. No language interpreter was used.    Past Medical History:  Diagnosis Date  . Closed fibular fracture 05/07/2012   right distal fib. - sledding accident  . Seizures [facility](H[facility]) age 49   febrile seizure x 1    Patient Active Problem List   Diagnosis Date Noted  . History of marijuana use 11/04/2018  . Intractable vomiting 11/04/2018  . Closed fibular fracture   . Concussion without loss of consciousness 12/21/2010    Past Surgical History:  Procedure Laterality Date  . ORIF FIBULA FRACTURE Right 05/19/2012   Procedure: OPEN REDUCTION INTERNAL FIXATION (ORIF) FIBULA FRACTURE;  Surgeon: Nilda Simmerobert A Wainer, MD;  Location: [facility];  Service: Orthopedics;  Laterality: Right;        Home Medications    Prior to Admission medications   Medication Sig Start Date End Date Taking? Authorizing Provider  amitriptyline (ELAVIL) 25 MG tablet Take 1 tablet (25 mg total) by mouth at bedtime. 11/04/18    Arnette FeltsMoore, Janece, FNP  ondansetron (ZOFRAN) 4 MG tablet Take 1 tablet (4 mg total) by mouth every 6 (six) hours as needed for nausea or vomiting. 10/31/18   Dione BoozeGlick, David, MD  potassium chloride SA (K-DUR) 20 MEQ tablet Take 1 tablet (20 mEq total) by mouth 2 (two) times daily. 10/31/18   Dione BoozeGlick, David, MD    Family History Family History  Problem Relation Age of Onset  . Diabetes Maternal Aunt        2 aunts  . Hypertension Maternal Aunt   . Heart disease Maternal Aunt        valve replacement  . Diabetes Maternal Grandmother     Social History Social History   Tobacco Use  . Smoking status: Passive Smoke Exposure - Never Smoker  . Smokeless tobacco: Never Used  . Tobacco comment: mother smokes outside  Substance Use Topics  . Alcohol use: No  . Drug use: No     Allergies   Patient has no known allergies.   Review of Systems Review of Systems  All other systems reviewed and are negative.    Physical Exam Updated Vital Signs Temp 99.1 F (37.3 C)   Physical Exam Vitals signs and nursing note reviewed.  Constitutional:      Appearance: He is well-developed.     Comments: Appears very uncomfortable  HENT:     Head: Normocephalic and atraumatic.  Eyes:     Conjunctiva/sclera: Conjunctivae normal.  Neck:     Musculoskeletal: Neck supple.  Cardiovascular:     Rate and Rhythm: Normal rate and regular rhythm.     Heart sounds: No murmur.  Pulmonary:     Effort: Pulmonary effort is normal. No respiratory distress.     Breath sounds: Normal breath sounds.  Abdominal:     Palpations: Abdomen is soft.     Tenderness: There is abdominal tenderness.     Comments: Diffuse abdominal tenderness  Genitourinary:    Comments: Chaperone present for rectal exam, rectal vault is empty Skin:    General: Skin is warm and dry.  Neurological:     Mental Status: He is alert and oriented to person, place, and time.  Psychiatric:        Mood and Affect: Mood normal.         Behavior: Behavior normal.      ED Treatments / Results  Labs (all labs ordered are listed, but only abnormal results are displayed) Labs Reviewed  CBC WITH DIFFERENTIAL/PLATELET - Abnormal; Notable for the following components:      Result Value   RDW 11.4 (*)    All other components within normal limits  COMPREHENSIVE METABOLIC PANEL  LACTIC ACID, PLASMA    EKG None  Radiology No results found.  Procedures Procedures (including critical care time)  Medications Ordered in ED Medications  morphine 4 MG/ML injection 4 mg (has no administration in time range)  ondansetron (ZOFRAN) injection 4 mg (has no administration in time range)  sodium chloride 0.9 % bolus 1,000 mL (has no administration in time range)     Initial Impression / Assessment and Plan / ED Course  I have reviewed the triage vital signs and the nursing notes.  Pertinent labs & imaging results that were available during my care of the patient were reviewed by me and considered in my medical decision making (see chart for details).        Patient with severe abdominal pain x2 weeks.  He has associated nausea and vomiting.  Has been unable to have bowel movement for the past 2 weeks.  Recent CT showed no acute surgical findings, but there was some high density material, which was thought to be medications, but non-organic ingested substance was also considered.  Patient denies having eaten anything other than food.  Denies putting anything out of his rectum.  Given that he is having persistent symptoms, has empty rectal vault, will recheck labs and repeat CT to assess for any changes.  Of note, patient's mother Bolivar Haw is a Biomedical engineer upstairs.  She would like to be kept updated and can be reached at [phone].  Patient signed out to [place], [place], who will continue care.  Plan: Follow-up on CT.  If CT negative, but still not tolerating PO, will need admission for intractable vomiting.  Final  Clinical Impressions(s) / ED Diagnoses   Final diagnoses:  Generalized abdominal pain    ED Discharge Orders    None       Montine Circle, PA-C 11/12/18 [id number]    Ripley Fraise, MD 11/12/18 [phone]

## 2018-11-12 NOTE — ED Notes (Signed)
Urine culture collected and sent to main lab. 

## 2018-11-12 NOTE — ED Provider Notes (Signed)
11:45 AM signout from [facility] at shift change.  Patient here with persistent abdominal pain and vomiting.  He has had several ED visits earlier this month for same.  Currently pending CT imaging.  CT earlier this month without acute abnormality.  CT reviewed and was unremarkable.  Lab work is normal.  Patient will continue to be hydrated until he can urinate.  We will continue to treat nausea and encouraged him to drink fluids.  Suspect possible cannabinoid hyperemesis.  I spoke with patient's mother by telephone after receiving permission from patient.  We discussed reassuring work-up today.  Discussed will continue hydration and nausea treatment.  Discussed that he will need to likely follow-up with GI.  BP 129/87 (BP Location: Right Arm)   Pulse 61   Temp 99.1 F (37.3 C)   Resp 16   SpO2 99%    1:53 PM spoke with patient and mother at bedside.  We discussed results today.  Patient is now urinating.  No vomiting since CT scan.  He has upcoming GI appointment in about 2 weeks.  On reevaluation, patient resting comfortably in bed.  Current plan is for discharged home, maintain clear liquid diet for the next 24 to 48 hours.  Will try Reglan to promote gastric emptying, Carafate and omeprazole to treat any potential gastritis or peptic ulcer disease, Bentyl for abdominal cramping.  Mother states that patient has not had marijuana in the previous couple of weeks.  Although he has been at home by himself recently.  Patient will benefit from GI evaluation.  The patient was urged to return to the Emergency Department immediately with worsening of current symptoms, worsening abdominal pain, persistent vomiting, blood noted in stools, fever, or any other concerns. The patient verbalized understanding.   BP 114/83 (BP Location: Right Arm)   Pulse 96   Temp 99.1 F (37.3 C)   Resp 17   SpO2 99%       Carlisle Cater, PA-C 11/12/18 1354    Charlesetta Shanks, MD 11/15/18 [phone]

## 2018-11-12 NOTE — ED Notes (Signed)
Pt is aware that a urinalysis is needed.

## 2018-11-26 ENCOUNTER — Other Ambulatory Visit: Payer: Self-pay | Admitting: Nurse Practitioner

## 2018-11-26 ENCOUNTER — Ambulatory Visit: Payer: 59 | Admitting: Physician Assistant

## 2018-11-26 DIAGNOSIS — R112 Nausea with vomiting, unspecified: Secondary | ICD-10-CM

## 2018-11-26 NOTE — Telephone Encounter (Signed)
Would like refill on Amitriptyline HCL 25mg  tab

## 2018-12-09 ENCOUNTER — Ambulatory Visit: Payer: Managed Care, Other (non HMO) | Admitting: Physician Assistant

## 2019-03-15 ENCOUNTER — Telehealth: Payer: Managed Care, Other (non HMO) | Admitting: Family

## 2019-03-15 DIAGNOSIS — Z20822 Contact with and (suspected) exposure to covid-19: Secondary | ICD-10-CM

## 2019-03-15 MED ORDER — ALBUTEROL SULFATE HFA 108 (90 BASE) MCG/ACT IN AERS: 2.0000 | INHALATION_SPRAY | Freq: Four times a day (QID) | RESPIRATORY_TRACT | 0 refills | Status: DC | PRN

## 2019-03-15 MED ORDER — BENZONATATE 100 MG PO CAPS: 100.0000 mg | ORAL_CAPSULE | Freq: Three times a day (TID) | ORAL | 0 refills | Status: DC | PRN

## 2019-03-15 NOTE — Progress Notes (Signed)
E-Visit for Corona Virus Screening   Your current symptoms could be consistent with the coronavirus.  Many health care providers can now test patients at their office but not all are.  [place] has multiple testing sites. For information on our COVID testing locations and hours go to [email or web address]  We are enrolling you in our MyChart Home Monitoring for COVID19 . Daily you will receive a questionnaire within the MyChart website. Our COVID 19 response team will be monitoring your responses daily.  Testing Information: The COVID-19 Community Testing sites will begin testing BY APPOINTMENT ONLY.  You can schedule online at [email or web address]  If you do not have access to a smart phone or computer you may call [phone] for an appointment.  Testing Locations: Appointment schedule is 8 am to 3:30 pm at all sites  [place] indoors at [address], [place] [address] [facility]  indoors at [address] Visitors Entrance, [place], [place] [address] [place] indoors at [address], [place] [address]  Additional testing sites in the Community:  . For [facility] Testing sites in [place]  https://www.cvs.com/minuteclinic/covid-19-testing  . For Pop-up testing sites in [place]  https://covid19.ncdhhs.gov/about-covid-19/testing/find-my-testing-place/pop-testing-sites  . For Testing sites with regular hours https://onsms.org/[place]/  . For [place] https://[facility]medicine.com/covid-19-community-outreach-testing/  . For [facility] https://www.[place].gov[place]/human-services/health-department/coronavirus-covid-19-info/covid-19-testing  . For [place] testing in [place] https://www.[place].gov/[place]/human-services/health-department/coronavirus-covid-19-info/covid-19-testing  . For [facility] testing in [place]   https://lhi.care/covidtesting  For  more  information about community testing call [phone]   We are enrolling you in our MyChart Home Monitoring for COVID19 . Daily you will receive a questionnaire within the MyChart website. Our COVID 19 response team will be monitoring your responses daily.  Please quarantine yourself while awaiting your test results. If you develop fever/cough/breathlessness, please stay home for 10 days with improving symptoms and until you have had 24 hours of no fever (without taking a fever reducer).  You should wear a mask or cloth face covering over your nose and mouth if you must be around other people or animals, including pets (even at home). Try to stay at least 6 feet away from other people. This will protect the people around you.  Please continue good preventive care measures, including:  frequent hand-washing, avoid touching your face, cover coughs/sneezes, stay out of crowds and keep a 6 foot distance from others.  COVID-19 is a respiratory illness with symptoms that are similar to the flu. Symptoms are typically mild to moderate, but there have been cases of severe illness and death due to the virus.   The following symptoms may appear 2-14 days after exposure: . Fever . Cough . Shortness of breath or difficulty breathing . Chills . Repeated shaking with chills . Muscle pain . Headache . Sore throat . New loss of taste or smell . Fatigue . Congestion or runny nose . Nausea or vomiting . Diarrhea  Go to the nearest hospital ED for assessment if fever/cough/breathlessness are severe or illness seems like a threat to life.  It is vitally important that if you feel that you have an infection such as this virus or any other virus that you stay home and away from places where you may spread it to others.  You should avoid contact with people age 65 and older.   You can use medication such as A prescription cough medication called Tessalon Perles 100 mg. You may take 1-2 capsules every 8 hours as    needed for cough and A prescription inhaler called Albuterol MDI 90 mcg /actuation 2 puffs every 4 hours as needed for shortness of breath, wheezing, cough.  If your vision changes or does not improve you need to be seen face to face.   You may also take acetaminophen (Tylenol) as needed for fever.  Reduce your risk of any infection by using the same precautions used for avoiding the common cold or flu:  Marland Kitchen Wash your hands often with soap and warm water for at least 20 seconds.  If soap and water are not readily available, use an alcohol-based hand sanitizer with at least 60% alcohol.  . If coughing or sneezing, cover your mouth and nose by coughing or sneezing into the elbow areas of your shirt or coat, into a tissue or into your sleeve (not your hands). . Avoid shaking hands with others and consider head nods or verbal greetings only. . Avoid touching your eyes, nose, or mouth with unwashed hands.  . Avoid close contact with people who are sick. . Avoid places or events with large numbers of people in one location, like concerts or sporting events. . Carefully consider travel plans you have or are making. . If you are planning any travel outside or inside the [place], visit the CDC's Travelers' Health webpage for the latest health notices. . If you have some symptoms but not all symptoms, continue to monitor at home and seek medical attention if your symptoms worsen. . If you are having a medical emergency, call 911.  HOME CARE . Only take medications as instructed by your medical team. . Drink plenty of fluids and get plenty of rest. . A steam or ultrasonic humidifier can help if you have congestion.   GET HELP RIGHT AWAY IF YOU HAVE EMERGENCY WARNING SIGNS** FOR COVID-19. If you or someone is showing any of these signs seek emergency medical care immediately. Call 911 or proceed to your closest emergency facility if: . You develop worsening high fever. . Trouble breathing . Bluish lips or  face . Persistent pain or pressure in the chest . New confusion . Inability to wake or stay awake . You cough up blood. . Your symptoms become more severe  **This list is not all possible symptoms. Contact your medical provider for any symptoms that are sever or concerning to you.  MAKE SURE YOU   Understand these instructions.  Will watch your condition.  Will get help right away if you are not doing well or get worse.  Your e-visit answers were reviewed by a board certified advanced clinical practitioner to complete your personal care plan.  Depending on the condition, your plan could have included both over the counter or prescription medications.  If there is a problem please reply once you have received a response from your provider.  Your safety is important to [place].  If you have drug allergies check your prescription carefully.    You can use MyChart to ask questions about today's visit, request a non-urgent call back, or ask for a work or school excuse for 24 hours related to this e-Visit. If it has been greater than 24 hours you will need to follow up with your provider, or enter a new e-Visit to address those concerns. You will get an e-mail in the next two days asking about your experience.  I hope that your e-visit has been valuable and will speed your recovery. Thank you for using e-visits.  Approximately 5 minutes was spent  documenting and reviewing patient's chart.

## 2019-03-17 ENCOUNTER — Ambulatory Visit: Payer: Managed Care, Other (non HMO) | Attending: Internal Medicine

## 2019-03-17 ENCOUNTER — Other Ambulatory Visit: Payer: Managed Care, Other (non HMO)

## 2019-03-17 DIAGNOSIS — Z20822 Contact with and (suspected) exposure to covid-19: Secondary | ICD-10-CM

## 2019-03-19 LAB — NOVEL CORONAVIRUS, NAA: SARS-CoV-2, NAA: DETECTED — AB

## 2019-03-24 ENCOUNTER — Other Ambulatory Visit: Payer: Managed Care, Other (non HMO)

## 2019-03-25 ENCOUNTER — Ambulatory Visit: Payer: Managed Care, Other (non HMO) | Attending: Internal Medicine

## 2019-03-25 DIAGNOSIS — Z20822 Contact with and (suspected) exposure to covid-19: Secondary | ICD-10-CM

## 2019-03-27 LAB — NOVEL CORONAVIRUS, NAA: SARS-CoV-2, NAA: NOT DETECTED

## 2020-01-18 IMAGING — CT CT ABDOMEN AND PELVIS WITH CONTRAST
2 of 4 series · 16 of 46 positions shown, 18 images · IV contrast (omnipaque)
Comparison: None.

CLINICAL DATA: 23-year-old male with acute abdominal and pelvic
pain with nausea and vomiting today.

EXAM:
CT ABDOMEN AND PELVIS WITH CONTRAST
TECHNIQUE: Multidetector CT imaging of the abdomen and pelvis was performed
using the standard protocol following bolus administration of
intravenous contrast.
CONTRAST:  100mL OMNIPAQUE IOHEXOL 300 MG/ML  SOLN

[Series 3: abdomen 5.0 · axial · 0.74mm/px · z∈[-370,+25]mm · 13 of 89 slices shown, 15 images]
[im 5/89  soft-tissue]
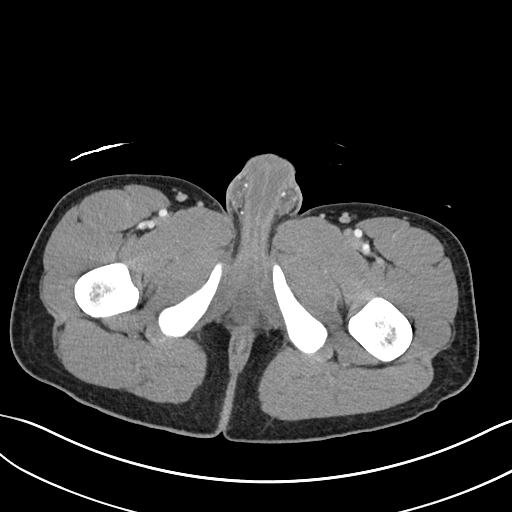
[im 5/89  bone]
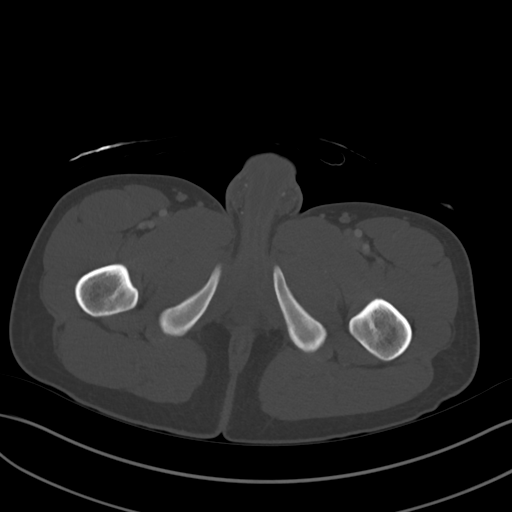
[im 14/89  soft-tissue]
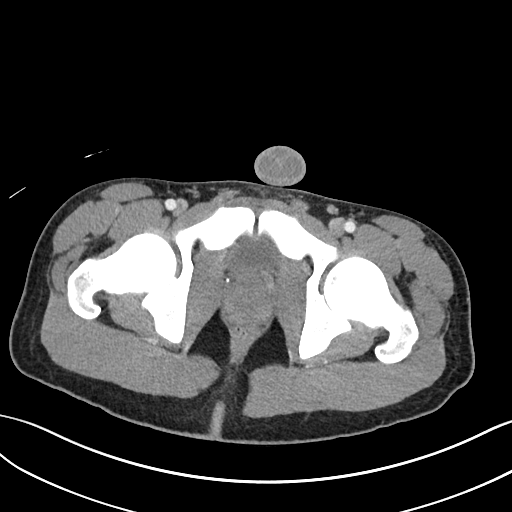
[im 19/89  soft-tissue]
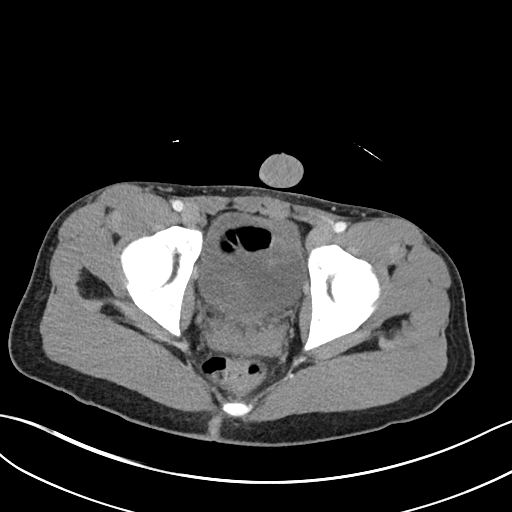
[im 24/89  soft-tissue]
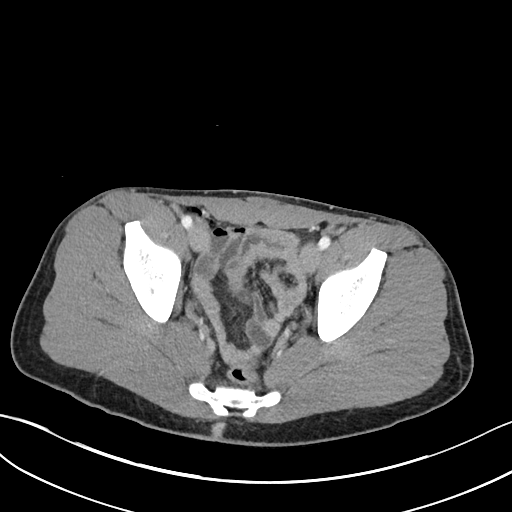
[im 33/89  soft-tissue]
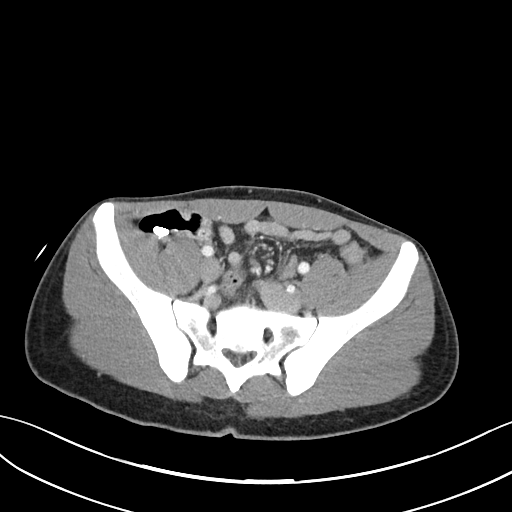
[im 38/89  soft-tissue]
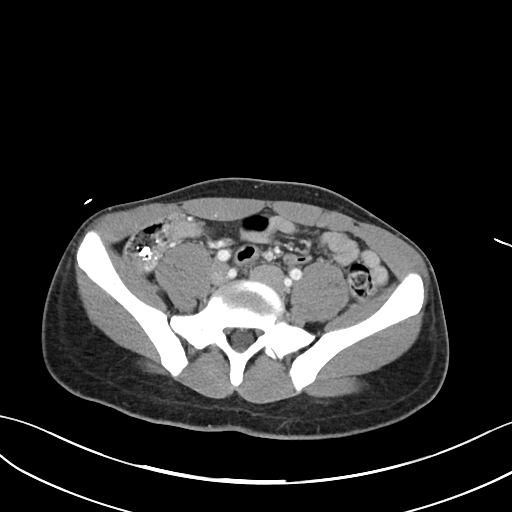
[im 47/89  soft-tissue]
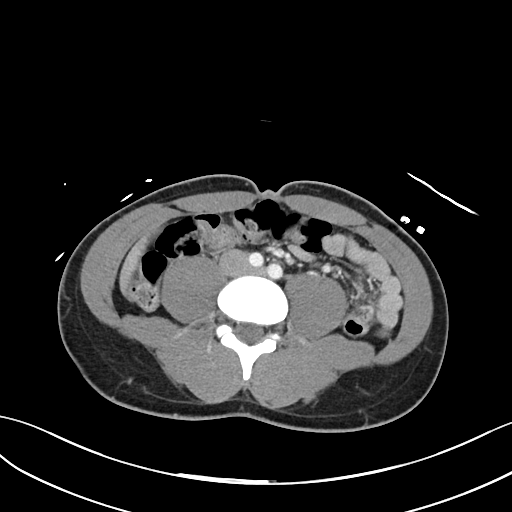
[im 51/89  soft-tissue]
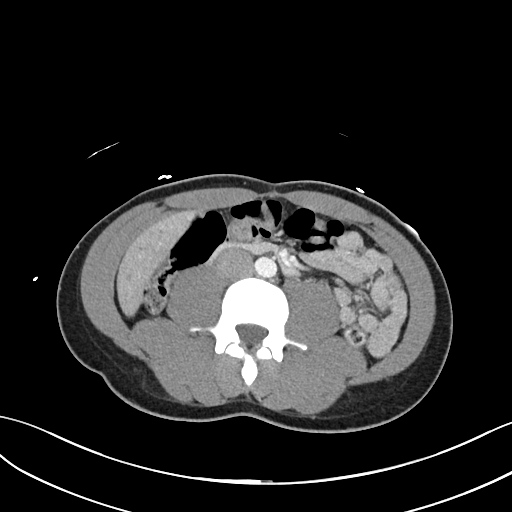
[im 56/89  soft-tissue]
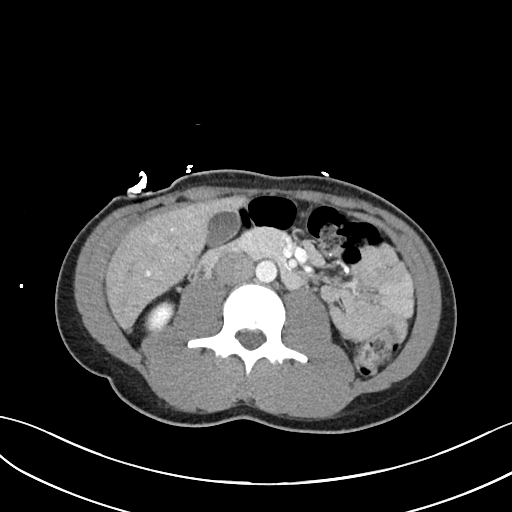
[im 56/89  bone]
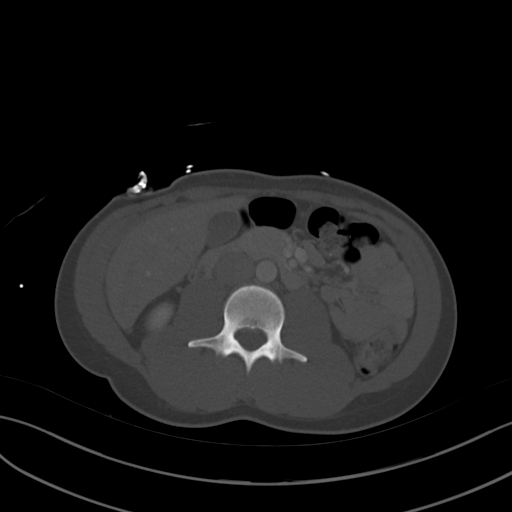
[im 65/89  soft-tissue]
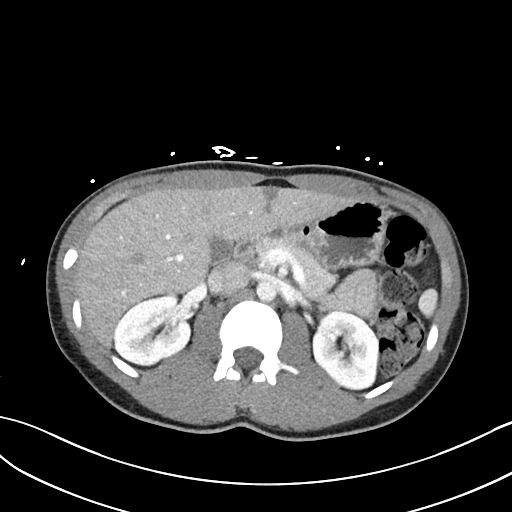
[im 70/89  soft-tissue]
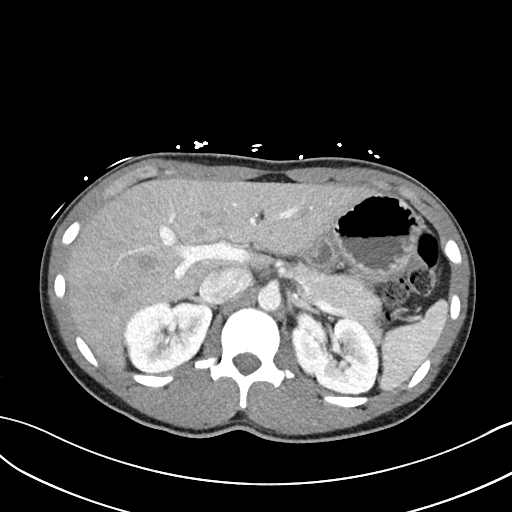
[im 75/89  soft-tissue]
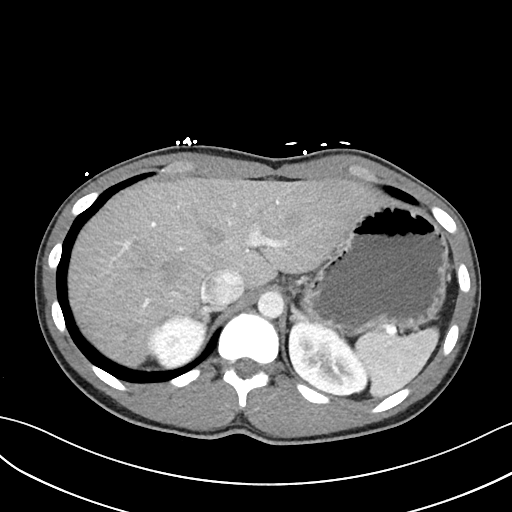
[im 84/89  soft-tissue]
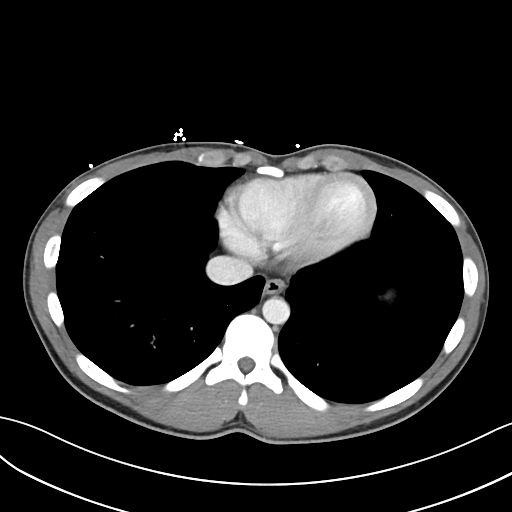

[Series 6: abdomen 3.0 mpr cor · coronal · 0.75mm/px · 3 of 71 slices shown]
[im 24/71  soft-tissue]
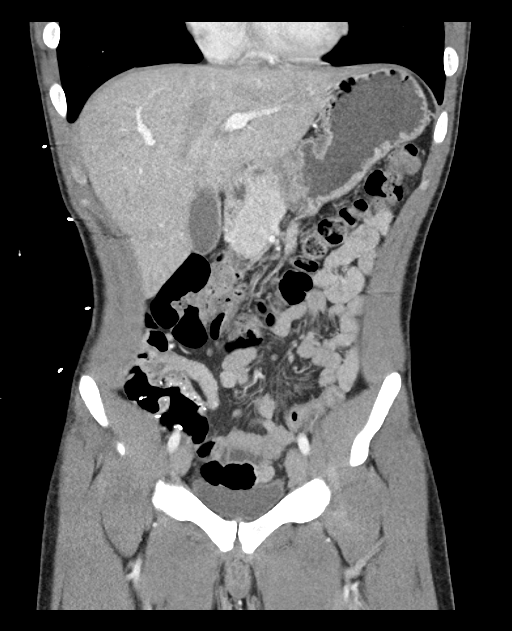
[im 32/71  soft-tissue]
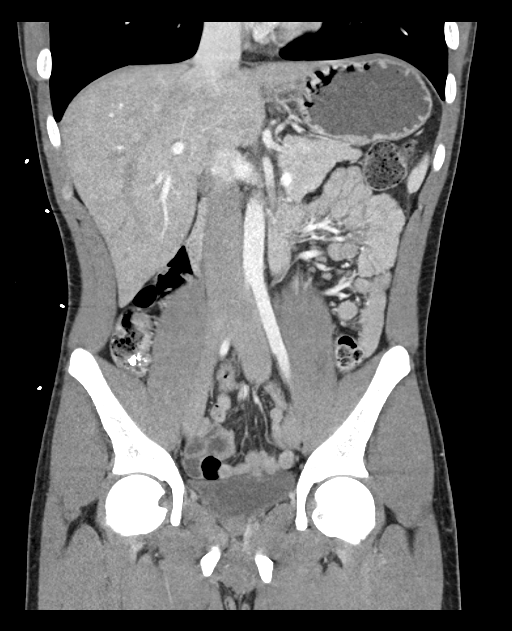
[im 39/71  soft-tissue]
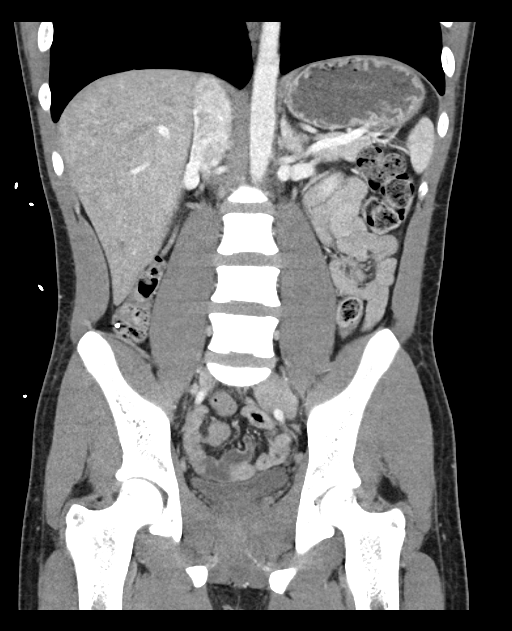

[16 of 46 positions shown; findings below may reference images not displayed]

FINDINGS: Lower chest: Unremarkable.

Hepatobiliary: No significant hepatic or gallbladder abnormalities.
No biliary dilatation.

Pancreas: Unremarkable

Spleen: Unremarkable

Adrenals/Urinary Tract: The kidneys, adrenal glands and bladder are
unremarkable.

Stomach/Bowel: High density material within the RIGHT colon and
stomach noted-question medications versus less likely ingested non
food foreign material.

No bowel dilatation, evidence of bowel obstruction, bowel wall
thickening or inflammation noted. The appendix is normal.

Vascular/Lymphatic: No significant vascular findings are present. No
enlarged abdominal or pelvic lymph nodes.

Reproductive: Prostate is unremarkable.

Other: No ascites, focal collection or pneumoperitoneum. No
abdominal wall hernia.

Musculoskeletal: No acute or suspicious bony abnormalities.
IMPRESSION: 1. No evidence of acute abnormality.
2. High density material within the RIGHT colon and stomach-favor
medications versus less likely ingested non food foreign material.
Correlate clinically.

## 2020-04-05 ENCOUNTER — Encounter: Payer: Self-pay | Admitting: Nurse Practitioner

## 2020-04-05 ENCOUNTER — Other Ambulatory Visit: Payer: Self-pay

## 2020-05-02 ENCOUNTER — Encounter: Payer: Self-pay | Admitting: Nurse Practitioner

## 2020-05-04 ENCOUNTER — Other Ambulatory Visit: Payer: Self-pay

## 2020-05-04 ENCOUNTER — Encounter (HOSPITAL_COMMUNITY): Payer: Self-pay | Admitting: Emergency Medicine

## 2020-05-04 ENCOUNTER — Inpatient Hospital Stay (HOSPITAL_COMMUNITY)
Admission: EM | Admit: 2020-05-04 | Discharge: 2020-05-06 | DRG: 684 | Disposition: A | Discharge status: Home / Self Care | Payer: Self-pay | Attending: Internal Medicine | Admitting: Internal Medicine

## 2020-05-04 DIAGNOSIS — D751 Secondary polycythemia: Secondary | ICD-10-CM

## 2020-05-04 DIAGNOSIS — D72828 Other elevated white blood cell count: Secondary | ICD-10-CM | POA: Diagnosis present

## 2020-05-04 DIAGNOSIS — Z79899 Other long term (current) drug therapy: Secondary | ICD-10-CM

## 2020-05-04 DIAGNOSIS — F17290 Nicotine dependence, other tobacco product, uncomplicated: Secondary | ICD-10-CM | POA: Diagnosis present

## 2020-05-04 DIAGNOSIS — E860 Dehydration: Secondary | ICD-10-CM | POA: Diagnosis present

## 2020-05-04 DIAGNOSIS — R109 Unspecified abdominal pain: Secondary | ICD-10-CM

## 2020-05-04 DIAGNOSIS — E876 Hypokalemia: Secondary | ICD-10-CM | POA: Diagnosis present

## 2020-05-04 DIAGNOSIS — D72829 Elevated white blood cell count, unspecified: Secondary | ICD-10-CM

## 2020-05-04 DIAGNOSIS — N179 Acute kidney failure, unspecified: Principal | ICD-10-CM | POA: Diagnosis present

## 2020-05-04 DIAGNOSIS — R1013 Epigastric pain: Secondary | ICD-10-CM

## 2020-05-04 DIAGNOSIS — R3911 Hesitancy of micturition: Secondary | ICD-10-CM | POA: Diagnosis present

## 2020-05-04 DIAGNOSIS — Z20822 Contact with and (suspected) exposure to covid-19: Secondary | ICD-10-CM | POA: Diagnosis present

## 2020-05-04 LAB — CBC
HCT: 51.2 % (ref 39.0–52.0)
Hemoglobin: 18.8 g/dL — ABNORMAL HIGH (ref 13.0–17.0)
MCH: 31.9 pg (ref 26.0–34.0)
MCHC: 36.7 g/dL — ABNORMAL HIGH (ref 30.0–36.0)
MCV: 86.9 fL (ref 80.0–100.0)
Platelets: 303 10*3/uL (ref 150–400)
RBC: 5.89 MIL/uL — ABNORMAL HIGH (ref 4.22–5.81)
RDW: 11.9 % (ref 11.5–15.5)
WBC: 19.8 10*3/uL — ABNORMAL HIGH (ref 4.0–10.5)
nRBC: 0 % (ref 0.0–0.2)

## 2020-05-04 LAB — COMPREHENSIVE METABOLIC PANEL
ALT: 37 U/L (ref 0–44)
AST: 25 U/L (ref 15–41)
Albumin: 5.8 g/dL — ABNORMAL HIGH (ref 3.5–5.0)
Alkaline Phosphatase: 118 U/L (ref 38–126)
Anion gap: 21 — ABNORMAL HIGH (ref 5–15)
BUN: 33 mg/dL — ABNORMAL HIGH (ref 6–20)
CO2: 17 mmol/L — ABNORMAL LOW (ref 22–32)
Calcium: 11.3 mg/dL — ABNORMAL HIGH (ref 8.9–10.3)
Chloride: 97 mmol/L — ABNORMAL LOW (ref 98–111)
Creatinine, Ser: 3.2 mg/dL — ABNORMAL HIGH (ref 0.61–1.24)
GFR, Estimated: 27 mL/min — ABNORMAL LOW (ref >60)
Glucose, Bld: 119 mg/dL — ABNORMAL HIGH (ref 70–99)
Potassium: 3.4 mmol/L — ABNORMAL LOW (ref 3.5–5.1)
Sodium: 135 mmol/L (ref 135–145)
Total Bilirubin: 1.8 mg/dL — ABNORMAL HIGH (ref 0.3–1.2)
Total Protein: 10 g/dL — ABNORMAL HIGH (ref 6.5–8.1)

## 2020-05-04 LAB — LIPASE, BLOOD: Lipase: 21 U/L (ref 11–51)

## 2020-05-04 MED ORDER — OXYCODONE-ACETAMINOPHEN 5-325 MG PO TABS
1.0000 | ORAL_TABLET | Freq: Once | ORAL | Status: AC
  Administered 2020-05-04: 1 via ORAL
  Filled 2020-05-04: qty 1

## 2020-05-04 NOTE — ED Triage Notes (Signed)
Patient reports mid abdominal pain with emesis , diarrhea , constipation and urinary retention onset yesterday , no fever or chills .

## 2020-05-04 NOTE — ED Notes (Addendum)
Pt is tachy between 105-130. Pt is diaphoretic and says he is in pain. This tech has notified triage nurse.

## 2020-05-04 NOTE — ED Notes (Signed)
Bladder scan showed 0ml.  

## 2020-05-05 ENCOUNTER — Encounter (HOSPITAL_COMMUNITY): Payer: Self-pay | Admitting: Family Medicine

## 2020-05-05 ENCOUNTER — Emergency Department (HOSPITAL_COMMUNITY): Payer: Self-pay

## 2020-05-05 DIAGNOSIS — R109 Unspecified abdominal pain: Secondary | ICD-10-CM

## 2020-05-05 DIAGNOSIS — N179 Acute kidney failure, unspecified: Principal | ICD-10-CM

## 2020-05-05 DIAGNOSIS — D72829 Elevated white blood cell count, unspecified: Secondary | ICD-10-CM

## 2020-05-05 LAB — URINALYSIS, ROUTINE W REFLEX MICROSCOPIC
Bilirubin Urine: NEGATIVE
Bilirubin Urine: NEGATIVE
Glucose, UA: NEGATIVE mg/dL
Glucose, UA: NEGATIVE mg/dL
Hgb urine dipstick: NEGATIVE
Ketones, ur: NEGATIVE mg/dL
Ketones, ur: NEGATIVE mg/dL
Leukocytes,Ua: NEGATIVE
Leukocytes,Ua: NEGATIVE
Nitrite: NEGATIVE
Nitrite: NEGATIVE
Protein, ur: 100 mg/dL — AB
Protein, ur: 100 mg/dL — AB
Specific Gravity, Urine: >1.03 — ABNORMAL HIGH (ref 1.005–1.030)
Specific Gravity, Urine: >1.03 — ABNORMAL HIGH (ref 1.005–1.030)
pH: 5 (ref 5.0–8.0)
pH: 5.5 (ref 5.0–8.0)

## 2020-05-05 LAB — RAPID URINE DRUG SCREEN, HOSP PERFORMED
Amphetamines: NOT DETECTED
Barbiturates: NOT DETECTED
Benzodiazepines: NOT DETECTED
Cocaine: NOT DETECTED
Opiates: POSITIVE — AB
Tetrahydrocannabinol: POSITIVE — AB

## 2020-05-05 LAB — CBC
HCT: 50.3 % (ref 39.0–52.0)
Hemoglobin: 17.7 g/dL — ABNORMAL HIGH (ref 13.0–17.0)
MCH: 31.2 pg (ref 26.0–34.0)
MCHC: 35.2 g/dL (ref 30.0–36.0)
MCV: 88.7 fL (ref 80.0–100.0)
Platelets: 250 10*3/uL (ref 150–400)
RBC: 5.67 MIL/uL (ref 4.22–5.81)
RDW: 12 % (ref 11.5–15.5)
WBC: 17.4 10*3/uL — ABNORMAL HIGH (ref 4.0–10.5)
nRBC: 0 % (ref 0.0–0.2)

## 2020-05-05 LAB — COMPREHENSIVE METABOLIC PANEL
ALT: 34 U/L (ref 0–44)
AST: 22 U/L (ref 15–41)
Albumin: 4.9 g/dL (ref 3.5–5.0)
Alkaline Phosphatase: 111 U/L (ref 38–126)
Anion gap: 16 — ABNORMAL HIGH (ref 5–15)
BUN: 36 mg/dL — ABNORMAL HIGH (ref 6–20)
CO2: 23 mmol/L (ref 22–32)
Calcium: 10.3 mg/dL (ref 8.9–10.3)
Chloride: 97 mmol/L — ABNORMAL LOW (ref 98–111)
Creatinine, Ser: 1.67 mg/dL — ABNORMAL HIGH (ref 0.61–1.24)
GFR, Estimated: 59 mL/min — ABNORMAL LOW (ref >60)
Glucose, Bld: 118 mg/dL — ABNORMAL HIGH (ref 70–99)
Potassium: 3.4 mmol/L — ABNORMAL LOW (ref 3.5–5.1)
Sodium: 136 mmol/L (ref 135–145)
Total Bilirubin: 2.5 mg/dL — ABNORMAL HIGH (ref 0.3–1.2)
Total Protein: 9.1 g/dL — ABNORMAL HIGH (ref 6.5–8.1)

## 2020-05-05 LAB — URINALYSIS, MICROSCOPIC (REFLEX)

## 2020-05-05 LAB — MAGNESIUM: Magnesium: 2.2 mg/dL (ref 1.7–2.4)

## 2020-05-05 LAB — HIV ANTIBODY (ROUTINE TESTING W REFLEX): HIV Screen 4th Generation wRfx: NONREACTIVE

## 2020-05-05 LAB — SARS CORONAVIRUS 2 (TAT 6-24 HRS): SARS Coronavirus 2: NEGATIVE

## 2020-05-05 MED ORDER — ONDANSETRON HCL 4 MG PO TABS: 4.0000 mg | ORAL_TABLET | Freq: Four times a day (QID) | ORAL | Status: DC | PRN

## 2020-05-05 MED ORDER — MORPHINE SULFATE (PF) 4 MG/ML IV SOLN
4.0000 mg | Freq: Once | INTRAVENOUS | Status: AC
  Administered 2020-05-05: 4 mg via INTRAVENOUS
  Filled 2020-05-05: qty 1

## 2020-05-05 MED ORDER — ALUM & MAG HYDROXIDE-SIMETH 200-200-20 MG/5ML PO SUSP: 15.0000 mL | ORAL | Status: DC | PRN

## 2020-05-05 MED ORDER — ACETAMINOPHEN 325 MG PO TABS
650.0000 mg | ORAL_TABLET | Freq: Four times a day (QID) | ORAL | Status: DC | PRN
  Administered 2020-05-05 – 2020-05-06 (×2): 650 mg via ORAL
  Filled 2020-05-05 (×2): qty 2

## 2020-05-05 MED ORDER — ONDANSETRON HCL 4 MG/2ML IJ SOLN
4.0000 mg | Freq: Once | INTRAMUSCULAR | Status: AC
  Administered 2020-05-05: 4 mg via INTRAVENOUS
  Filled 2020-05-05: qty 2

## 2020-05-05 MED ORDER — HYDROMORPHONE HCL 1 MG/ML IJ SOLN
0.5000 mg | INTRAMUSCULAR | Status: AC | PRN
  Administered 2020-05-05 (×3): 0.5 mg via INTRAVENOUS
  Filled 2020-05-05 (×3): qty 1

## 2020-05-05 MED ORDER — PANTOPRAZOLE SODIUM 40 MG IV SOLR
40.0000 mg | Freq: Two times a day (BID) | INTRAVENOUS | Status: DC
  Administered 2020-05-05 – 2020-05-06 (×3): 40 mg via INTRAVENOUS
  Filled 2020-05-05 (×3): qty 40

## 2020-05-05 MED ORDER — ACETAMINOPHEN 650 MG RE SUPP: 650.0000 mg | Freq: Four times a day (QID) | RECTAL | Status: DC | PRN

## 2020-05-05 MED ORDER — POTASSIUM CHLORIDE CRYS ER 20 MEQ PO TBCR
40.0000 meq | EXTENDED_RELEASE_TABLET | Freq: Once | ORAL | Status: AC
  Administered 2020-05-05: 40 meq via ORAL
  Filled 2020-05-05: qty 2

## 2020-05-05 MED ORDER — LACTATED RINGERS IV BOLUS
1000.0000 mL | Freq: Once | INTRAVENOUS | Status: AC
  Administered 2020-05-05: 1000 mL via INTRAVENOUS

## 2020-05-05 MED ORDER — HYDROMORPHONE HCL 1 MG/ML IJ SOLN
1.0000 mg | INTRAMUSCULAR | Status: AC | PRN
  Administered 2020-05-05 (×3): 1 mg via INTRAVENOUS
  Filled 2020-05-05 (×3): qty 1

## 2020-05-05 MED ORDER — ONDANSETRON HCL 4 MG/2ML IJ SOLN
4.0000 mg | Freq: Four times a day (QID) | INTRAMUSCULAR | Status: DC | PRN
  Administered 2020-05-05 – 2020-05-06 (×4): 4 mg via INTRAVENOUS
  Filled 2020-05-05 (×4): qty 2

## 2020-05-05 MED ORDER — LACTATED RINGERS IV SOLN
INTRAVENOUS | Status: DC
  Administered 2020-05-05: 1000 mL via INTRAVENOUS

## 2020-05-05 NOTE — ED Notes (Signed)
Patient has been advised that mother has called and patient states he will call her when he [facility]

## 2020-05-05 NOTE — Progress Notes (Signed)
Patient ID: Tom Jones, male   DOB: [birth year], 25 y.o.   MRN: [id number] Patient was admitted early this morning for pain, nausea and vomiting with creatinine of 3.2 and WBC of 19,800 with normal CT of abdomen and pelvis without contrast.  He has been started on IV fluids.  Patient seen and examined at bedside and plan of care discussed with him.  I have reviewed patient's medical records including this morning's H&P, current vitals, medications myself.  Labs pending this morning.  Continue IV fluids.  Repeat a.m. labs.

## 2020-05-05 NOTE — H&P (Signed)
History and Physical    Tom Jones XKP:[id number] DOB: 02-22-1996 DOA: 05/04/2020  PCP: Arnette Felts, FNP   Patient coming from: Home  Chief Complaint: Abdominal pain, nausea and vomiting  HPI: Tom Jones is a 25 y.o. male with no significant medical history presents for evaluation of abdominal pain that began two days ago. Reports pain is a sharp severe pain in mid to lower abdomen. Has associated nausea and vomiting.  Ports that he is not able to urinate normally over the last 2 days.  He states that he has to strain and has urinary hesitancy and just has a few drops of urine.  He states that he has no pain when urinating and states there is no blood.  States he has not had a bowel movement in the last 2 days.  Denies any injury or trauma to his abdomen or pelvis.  States he has not had any drainage from his penis.  He denies any testicular pain or masses.  He states he has not noticed any alleviating or aggravating factors for the pain.  States he has been drinking but he has not had much of an appetite and has not been eating much the last few days.  He has not taken anything for the pain at home. States he uses e-cigarettes and vapes when he is at work on occasion.   ED Course: Found to have cute renal failure with a creatinine of 3.20.  He also has an elevated white blood cell count of 19,800.  CT abdomen pelvis without contrast was obtained which showed no acute pathology.  Urinalysis has been ordered and is pending  Review of Systems:  General: Denies weakness, fever, chills, weight loss, night sweats.  Denies dizziness. Reports decreased appetite HENT: Denies head trauma, headache, denies change in hearing, tinnitus.  Denies nasal congestion or bleeding.  Denies sore throat, sores in mouth.  Denies difficulty swallowing Eyes: Denies blurry vision, pain in eye, drainage.  Denies discoloration of eyes. Neck: Denies pain.  Denies swelling.  Denies pain with movement. Cardiovascular:  Denies chest pain, palpitations.  Denies edema.  Denies orthopnea Respiratory: Denies shortness of breath, cough.  Denies wheezing.  Denies sputum production Gastrointestinal: Reports abdominal pain. Reports nausea, vomiting. Denies diarrhea.  Denies melena.  Denies hematemesis. Musculoskeletal: Denies limitation of movement.  Denies deformity or swelling.  Denies pain.  Denies arthralgias or myalgias. Genitourinary: Reports urinary hesitancy and decreased urine output. denies pelvic pain.  Denies urinary frequency.  Denies dysuria.  Skin: Denies rash.  Denies petechiae, purpura, ecchymosis. Neurological: Denies headache. Denies syncope. Denies seizure activity. Denies slurred speech, drooping face.  Denies visual change. Psychiatric: Denies depression, anxiety. Denies hallucinations.  Past Medical History:  Diagnosis Date  . Closed fibular fracture 05/07/2012   right distal fib. - sledding accident  . Seizures [facility]) age 32   febrile seizure x 1    Past Surgical History:  Procedure Laterality Date  . ORIF FIBULA FRACTURE Right 05/19/2012   Procedure: OPEN REDUCTION INTERNAL FIXATION (ORIF) FIBULA FRACTURE;  Surgeon: Nilda Simmer, MD;  Location: [facility];  Service: Orthopedics;  Laterality: Right;    Social History  reports that he has been smoking e-cigarettes. He has never used smokeless tobacco. He reports that he does not drink alcohol and does not use drugs.  No Known Allergies  Family History  Problem Relation Age of Onset  . Diabetes Maternal Aunt        2 aunts  .  Hypertension Maternal Aunt   . Heart disease Maternal Aunt        valve replacement  . Diabetes Maternal Grandmother      Prior to Admission medications   Medication Sig Start Date End Date Taking? Authorizing Provider  albuterol (VENTOLIN HFA) 108 (90 Base) MCG/ACT inhaler Inhale 2 puffs into the lungs every 6 (six) hours as needed for wheezing or shortness of breath. 03/15/19   Junie Spencer, FNP  amitriptyline (ELAVIL) 25 MG tablet TAKE 1 TABLET BY MOUTH EVERYDAY AT BEDTIME 11/26/18   Arnette Felts, FNP  benzonatate (TESSALON PERLES) 100 MG capsule Take 1 capsule (100 mg total) by mouth 3 (three) times daily as needed. 03/15/19   Junie Spencer, FNP  dicyclomine (BENTYL) 20 MG tablet Take 1 tablet (20 mg total) by mouth 2 (two) times daily. 11/12/18   Renne Crigler, PA-C  metoCLOPramide (REGLAN) 10 MG tablet Take 1 tablet (10 mg total) by mouth every 6 (six) hours. 11/12/18   Renne Crigler, PA-C  omeprazole (PRILOSEC) 20 MG capsule Take 1 capsule (20 mg total) by mouth daily. 11/12/18   Renne Crigler, PA-C  ondansetron (ZOFRAN) 4 MG tablet Take 1 tablet (4 mg total) by mouth every 6 (six) hours as needed for nausea or vomiting. 10/31/18   Dione Booze, MD  potassium chloride SA (K-DUR) 20 MEQ tablet Take 1 tablet (20 mEq total) by mouth 2 (two) times daily. 10/31/18   Dione Booze, MD  sucralfate (CARAFATE) 1 g tablet Take 1 tablet (1 g total) by mouth 4 (four) times daily -  with meals and at bedtime. 11/12/18   Renne Crigler, PA-C    Physical Exam: Vitals:   05/05/20 0045 05/05/20 0113 05/05/20 0130 05/05/20 0152  BP: (!) 141/94 (!) 135/99 (!) 147/107 (!) 158/86  Pulse: 69 92 77 63  Resp: 15 19 13 19   Temp:      TempSrc:      SpO2: 98% 100% 97% 100%  Weight:      Height:        Constitutional: NAD, calm, comfortable Vitals:   05/05/20 0045 05/05/20 0113 05/05/20 0130 05/05/20 0152  BP: (!) 141/94 (!) 135/99 (!) 147/107 (!) 158/86  Pulse: 69 92 77 63  Resp: 15 19 13 19   Temp:      TempSrc:      SpO2: 98% 100% 97% 100%  Weight:      Height:       General: WDWN, Alert and oriented x3.  Eyes: EOMI, PERRL, conjunctivae normal.  Sclera nonicteric HENT:  [place]/AT, external ears normal.  Nares patent without epistasis.  Mucous membranes are dry Neck: Soft, normal range of motion, supple, no masses, no thyromegaly. Trachea midline Respiratory: clear to auscultation  bilaterally, no wheezing, no crackles. Normal respiratory effort. No accessory muscle use.  Cardiovascular: Regular rate and rhythm, no murmurs / rubs / gallops. No extremity edema. 2+ pedal pulses.  Abdomen: Soft, diffuse tenderness, nondistended, no rebound or guarding.  No masses palpated. No hepatosplenomegaly. Bowel sounds normoactive Musculoskeletal: FROM. no cyanosis. No joint deformity upper and lower extremities. Normal muscle tone.  Skin: Warm, dry, intact no rashes, lesions, ulcers. No induration Neurologic: CN 2-12 grossly intact.  Normal speech.  Sensation intact Psychiatric: Normal judgment and insight.  Normal mood.    Labs on Admission: I have personally reviewed following labs and imaging studies  CBC: Recent Labs  Lab 05/04/20 2110  WBC 19.8*  HGB 18.8*  HCT 51.2  MCV 86.9  PLT 303    Basic Metabolic Panel: Recent Labs  Lab 05/04/20 2110  NA 135  K 3.4*  CL 97*  CO2 17*  GLUCOSE 119*  BUN 33*  CREATININE 3.20*  CALCIUM 11.3*    GFR: Estimated Creatinine Clearance: 35.9 mL/min (A) (by C-G formula based on SCr of 3.2 mg/dL (H)).  Liver Function Tests: Recent Labs  Lab 05/04/20 2110  AST 25  ALT 37  ALKPHOS 118  BILITOT 1.8*  PROT 10.0*  ALBUMIN 5.8*    Urine analysis:    Component Value Date/Time   COLORURINE YELLOW 05/05/2020 0134   APPEARANCEUR CLEAR 05/05/2020 0134   LABSPEC >1.030 (H) 05/05/2020 0134   PHURINE 5.0 05/05/2020 0134   GLUCOSEU NEGATIVE 05/05/2020 0134   HGBUR TRACE (A) 05/05/2020 0134   BILIRUBINUR NEGATIVE 05/05/2020 0134   KETONESUR NEGATIVE 05/05/2020 0134   PROTEINUR 100 (A) 05/05/2020 0134   NITRITE NEGATIVE 05/05/2020 0134   LEUKOCYTESUR NEGATIVE 05/05/2020 0134    Radiological Exams on Admission: CT ABDOMEN PELVIS WO CONTRAST  Result Date: 05/05/2020 CLINICAL DATA:  Central abdominal pain for 1 day EXAM: CT ABDOMEN AND PELVIS WITHOUT CONTRAST TECHNIQUE: Multidetector CT imaging of the abdomen and pelvis was  performed following the standard protocol without IV contrast. COMPARISON:  November 12, 2018 FINDINGS: Lower chest: The visualized heart size within normal limits. No pericardial fluid/thickening. No hiatal hernia. The visualized portions of the lungs are clear. Hepatobiliary: The liver is normal in density without focal abnormality.The main portal vein is patent. No evidence of calcified gallstones, gallbladder wall thickening or biliary dilatation. Pancreas: Unremarkable. No pancreatic ductal dilatation or surrounding inflammatory changes. Spleen: Normal in size without focal abnormality. Adrenals/Urinary Tract: Both adrenal glands appear normal. The kidneys and collecting system appear normal without evidence of urinary tract calculus or hydronephrosis. Bladder is unremarkable. Stomach/Bowel: The stomach, small bowel, and colon are normal in appearance. No inflammatory changes, wall thickening, or obstructive findings. There is a moderate amount of right colonic stool present.The appendix is normal. Vascular/Lymphatic: There are no enlarged mesenteric, retroperitoneal, or pelvic lymph nodes. No significant vascular findings are present. Reproductive: The prostate is unremarkable. Other: No evidence of abdominal wall mass or hernia. Musculoskeletal: No acute or significant osseous findings. IMPRESSION: No acute intra-abdominal or pelvic pathology to explain the patient's symptoms. Moderate amount right colonic stool. Electronically Signed   By: Jonna Clark M.D.   On: 05/05/2020 00:21    Assessment/Plan Principal Problem:   ARF (acute renal failure)  Mr. Fehringer is admitted to med/surg floor. noncontrast CT of abdomen and pelvis did not reveal acute pathology.  Renal ultrasound is pending.  IVF hydration with LR at 100 ml/hr.  Recheck renal function and electrolytes in am. If renal function does not improve will need nephrology consult.  UA is pending.   Active Problems:   Abdominal pain Diffuse  abdominal pain with no obvious etiology. Monitor    Hypercalcemia IVF hydration provided. Recheck calcium and electrolytes in am.  Check magnesium level    Leukocytosis Pt has had nausea and vomiting and WBC could be left shift. UA is pending and if infection identified will treat with antibiotics.  Check CBC in am.    DVT prophylaxis: Padua score low. Early ambulation and TED hose for DVT prophylaxis Code Status:   Full code Family Communication:  Diagnosis and plan discussed with patient.  Patient verbalized understanding agrees with plan.  Further recommendations to follow as clinically indicated Disposition Plan:   Patient is from:  Home  Anticipated DC to:  Home  Anticipated DC date:  Anticipate 2 midnight stay or more to treat acute condition  Anticipated DC barriers: No barriers to discharge identified at this time  Admission status:  Inpatient  Claudean Severance Rickey Sadowski MD [facility]  How to contact the [facility] Attending or Consulting provider 7A - 7P or covering provider during after hours 7P -7A, for this patient?   1. Check the care team in [facility] and look for a) attending/consulting [facility] provider listed and b) the [facility] team listed 2. Log into [email or web address] and use [place]'s universal password to access. If you do not have the password, please contact the hospital operator. 3. Locate the [facility] provider you are looking for under [facility] and page to a number that you can be directly reached. 4. If you still have difficulty reaching the provider, please page the [facility] (Director on Call) for the Hospitalists listed on amion for assistance.  05/05/2020, 2:11 AM

## 2020-05-05 NOTE — ED Notes (Addendum)
Transport here to take pt to the floor. Pt states that his pain is better. Report called to floor by previous RN.

## 2020-05-05 NOTE — ED Notes (Signed)
Breakfast Ordered 

## 2020-05-05 NOTE — ED Notes (Signed)
Pt yelling out in pain. Asking for pain medication.

## 2020-05-05 NOTE — ED Notes (Signed)
Transport placed in computer to go to the floor.

## 2020-05-05 NOTE — ED Provider Notes (Signed)
[facility] EMERGENCY DEPARTMENT Provider Note   CSN: [id number] Arrival date & time: 05/04/20  2036   History Chief Complaint  Patient presents with  . Abdominal Pain    Tom Jones is a 25 y.o. male.  The history is provided by the patient.  Abdominal Pain He comes in complaining of upper abdominal pain for the last 2 days. Pain is constant and severe and he rates it at 8/10. There is associated nausea and vomiting. During this time, he states he has not urinated or not had a bowel movement or passed any flatus. Nothing makes his pain better, nothing makes it worse. He has been drinking fluids but not eating. He has not done anything to treat his pain.  Past Medical History:  Diagnosis Date  . Closed fibular fracture 05/07/2012   right distal fib. - sledding accident  . Seizures [facility]) age 76   febrile seizure x 1    Patient Active Problem List   Diagnosis Date Noted  . History of marijuana use 11/04/2018  . Intractable vomiting 11/04/2018  . Closed fibular fracture   . Concussion without loss of consciousness 12/21/2010    Past Surgical History:  Procedure Laterality Date  . ORIF FIBULA FRACTURE Right 05/19/2012   Procedure: OPEN REDUCTION INTERNAL FIXATION (ORIF) FIBULA FRACTURE;  Surgeon: Nilda Simmer, MD;  Location: [facility];  Service: Orthopedics;  Laterality: Right;       Family History  Problem Relation Age of Onset  . Diabetes Maternal Aunt        2 aunts  . Hypertension Maternal Aunt   . Heart disease Maternal Aunt        valve replacement  . Diabetes Maternal Grandmother     Social History   Tobacco Use  . Smoking status: Passive Smoke Exposure - Never Smoker  . Smokeless tobacco: Never Used  . Tobacco comment: mother smokes outside  Substance Use Topics  . Alcohol use: No  . Drug use: No    Home Medications Prior to Admission medications   Medication Sig Start Date End Date Taking? Authorizing Provider   albuterol (VENTOLIN HFA) 108 (90 Base) MCG/ACT inhaler Inhale 2 puffs into the lungs every 6 (six) hours as needed for wheezing or shortness of breath. 03/15/19   Junie Spencer, FNP  amitriptyline (ELAVIL) 25 MG tablet TAKE 1 TABLET BY MOUTH EVERYDAY AT BEDTIME 11/26/18   Arnette Felts, FNP  benzonatate (TESSALON PERLES) 100 MG capsule Take 1 capsule (100 mg total) by mouth 3 (three) times daily as needed. 03/15/19   Junie Spencer, FNP  dicyclomine (BENTYL) 20 MG tablet Take 1 tablet (20 mg total) by mouth 2 (two) times daily. 11/12/18   Renne Crigler, PA-C  metoCLOPramide (REGLAN) 10 MG tablet Take 1 tablet (10 mg total) by mouth every 6 (six) hours. 11/12/18   Renne Crigler, PA-C  omeprazole (PRILOSEC) 20 MG capsule Take 1 capsule (20 mg total) by mouth daily. 11/12/18   Renne Crigler, PA-C  ondansetron (ZOFRAN) 4 MG tablet Take 1 tablet (4 mg total) by mouth every 6 (six) hours as needed for nausea or vomiting. 10/31/18   Dione Booze, MD  potassium chloride SA (K-DUR) 20 MEQ tablet Take 1 tablet (20 mEq total) by mouth 2 (two) times daily. 10/31/18   Dione Booze, MD  sucralfate (CARAFATE) 1 g tablet Take 1 tablet (1 g total) by mouth 4 (four) times daily -  with meals and at bedtime. 11/12/18  Renne Crigler, PA-C    Allergies    Patient has no known allergies.  Review of Systems   Review of Systems  Gastrointestinal: Positive for abdominal pain.  All other systems reviewed and are negative.   Physical Exam Updated Vital Signs BP (!) 139/104   Pulse 84   Temp 97.6 F (36.4 C) (Oral)   Resp 15   Ht 5\' 9"  (1.753 m)   Wt 82 kg   SpO2 100%   BMI 26.70 kg/m   Physical Exam Vitals and nursing note reviewed.   25 year old male, resting comfortably and in no acute distress. Vital signs are significant for elevated blood pressure. Oxygen saturation is 100%, which is normal. Head is normocephalic and atraumatic. PERRLA, EOMI. Oropharynx is clear. Neck is nontender and supple  without adenopathy or JVD. Back is nontender and there is no CVA tenderness. Lungs are clear without rales, wheezes, or rhonchi. Chest is nontender. Heart has regular rate and rhythm without murmur. Abdomen is soft, flat, with moderate epigastric tenderness. There is no rebound or guarding. There are no masses or hepatosplenomegaly and peristalsis is hypoactive. Bladder is not palpably enlarged. Extremities have no cyanosis or edema, full range of motion is present. Skin is warm and dry without rash. Neurologic: Mental status is normal, cranial nerves are intact, there are no motor or sensory deficits.  ED Results / Procedures / Treatments   Labs (all labs ordered are listed, but only abnormal results are displayed) Labs Reviewed  COMPREHENSIVE METABOLIC PANEL - Abnormal; Notable for the following components:      Result Value   Potassium 3.4 (*)    Chloride 97 (*)    CO2 17 (*)    Glucose, Bld 119 (*)    BUN 33 (*)    Creatinine, Ser 3.20 (*)    Calcium 11.3 (*)    Total Protein 10.0 (*)    Albumin 5.8 (*)    Total Bilirubin 1.8 (*)    GFR, Estimated 27 (*)    Anion gap 21 (*)    All other components within normal limits  CBC - Abnormal; Notable for the following components:   WBC 19.8 (*)    RBC 5.89 (*)    Hemoglobin 18.8 (*)    MCHC 36.7 (*)    All other components within normal limits  URINE CULTURE  SARS CORONAVIRUS 2 (TAT 6-24 HRS)  LIPASE, BLOOD  URINALYSIS, ROUTINE W REFLEX MICROSCOPIC   Radiology CT ABDOMEN PELVIS WO CONTRAST  Result Date: 05/05/2020 CLINICAL DATA:  Central abdominal pain for 1 day EXAM: CT ABDOMEN AND PELVIS WITHOUT CONTRAST TECHNIQUE: Multidetector CT imaging of the abdomen and pelvis was performed following the standard protocol without IV contrast. COMPARISON:  November 12, 2018 FINDINGS: Lower chest: The visualized heart size within normal limits. No pericardial fluid/thickening. No hiatal hernia. The visualized portions of the lungs are clear.  Hepatobiliary: The liver is normal in density without focal abnormality.The main portal vein is patent. No evidence of calcified gallstones, gallbladder wall thickening or biliary dilatation. Pancreas: Unremarkable. No pancreatic ductal dilatation or surrounding inflammatory changes. Spleen: Normal in size without focal abnormality. Adrenals/Urinary Tract: Both adrenal glands appear normal. The kidneys and collecting system appear normal without evidence of urinary tract calculus or hydronephrosis. Bladder is unremarkable. Stomach/Bowel: The stomach, small bowel, and colon are normal in appearance. No inflammatory changes, wall thickening, or obstructive findings. There is a moderate amount of right colonic stool present.The appendix is normal. Vascular/Lymphatic: There are  no enlarged mesenteric, retroperitoneal, or pelvic lymph nodes. No significant vascular findings are present. Reproductive: The prostate is unremarkable. Other: No evidence of abdominal wall mass or hernia. Musculoskeletal: No acute or significant osseous findings. IMPRESSION: No acute intra-abdominal or pelvic pathology to explain the patient's symptoms. Moderate amount right colonic stool. Electronically Signed   By: Jonna Clark M.D.   On: 05/05/2020 00:21    Procedures Procedures   Medications Ordered in ED Medications  lactated ringers bolus 1,000 mL (has no administration in time range)  morphine 4 MG/ML injection 4 mg (has no administration in time range)  ondansetron (ZOFRAN) injection 4 mg (has no administration in time range)  oxyCODONE-acetaminophen (PERCOCET/ROXICET) 5-325 MG per tablet 1 tablet (1 tablet Oral Given 05/04/20 2059)    ED Course  I have reviewed the triage vital signs and the nursing notes.  Pertinent labs & imaging results that were available during my care of the patient were reviewed by me and considered in my medical decision making (see chart for details).  MDM Rules/Calculators/A&P Abdominal pain  with anuria and constipation. Exam is relatively benign. However, labs do show acute kidney injury with creatinine of 3.2 and BUN 33. Metabolic acidosis is present consistent with renal failure. Mild hypokalemia is present as well as moderate hypercalcemia. Hemoglobin is increased consistent with dehydration. WBC is moderately elevated. Will check bladder scan and plan to send for CT of abdomen and pelvis. Old records are reviewed, and he does have prior ED visits for vomiting.  CT scan shows no acute process.  Bladder is not distended and kidneys are normal size.  No evidence of urinary obstruction.  Case is discussed with Dr. Rachael Darby of [facility], who agrees to admit the patient.  Final Clinical Impression(s) / ED Diagnoses Final diagnoses:  Acute renal failure, unspecified acute renal failure type (HCC)  Epigastric pain  Hypercalcemia  Hypokalemia  Polycythemia    Rx / DC Orders ED Discharge Orders    None       Dione Booze, MD 05/05/20 [phone]

## 2020-05-06 DIAGNOSIS — R109 Unspecified abdominal pain: Secondary | ICD-10-CM

## 2020-05-06 DIAGNOSIS — D72829 Elevated white blood cell count, unspecified: Secondary | ICD-10-CM

## 2020-05-06 LAB — CBC WITH DIFFERENTIAL/PLATELET
Abs Immature Granulocytes: 0.02 10*3/uL (ref 0.00–0.07)
Basophils Absolute: 0.1 10*3/uL (ref 0.0–0.1)
Basophils Relative: 1 %
Eosinophils Absolute: 0 10*3/uL (ref 0.0–0.5)
Eosinophils Relative: 0 %
HCT: 45.2 % (ref 39.0–52.0)
Hemoglobin: 15.2 g/dL (ref 13.0–17.0)
Immature Granulocytes: 0 %
Lymphocytes Relative: 15 %
Lymphs Abs: 1.5 10*3/uL (ref 0.7–4.0)
MCH: 30.4 pg (ref 26.0–34.0)
MCHC: 33.6 g/dL (ref 30.0–36.0)
MCV: 90.4 fL (ref 80.0–100.0)
Monocytes Absolute: 1.2 10*3/uL — ABNORMAL HIGH (ref 0.1–1.0)
Monocytes Relative: 11 %
Neutro Abs: 7.4 10*3/uL (ref 1.7–7.7)
Neutrophils Relative %: 73 %
Platelets: 210 10*3/uL (ref 150–400)
RBC: 5 MIL/uL (ref 4.22–5.81)
RDW: 11.9 % (ref 11.5–15.5)
WBC: 10.1 10*3/uL (ref 4.0–10.5)
nRBC: 0 % (ref 0.0–0.2)

## 2020-05-06 LAB — BASIC METABOLIC PANEL
Anion gap: 10 (ref 5–15)
BUN: 20 mg/dL (ref 6–20)
CO2: 25 mmol/L (ref 22–32)
Calcium: 9.4 mg/dL (ref 8.9–10.3)
Chloride: 101 mmol/L (ref 98–111)
Creatinine, Ser: 1.02 mg/dL (ref 0.61–1.24)
GFR, Estimated: >60 mL/min (ref >60)
Glucose, Bld: 102 mg/dL — ABNORMAL HIGH (ref 70–99)
Potassium: 3.9 mmol/L (ref 3.5–5.1)
Sodium: 136 mmol/L (ref 135–145)

## 2020-05-06 LAB — URINE CULTURE: Culture: NO GROWTH

## 2020-05-06 LAB — MAGNESIUM: Magnesium: 2.1 mg/dL (ref 1.7–2.4)

## 2020-05-06 MED ORDER — ONDANSETRON HCL 4 MG PO TABS: 4.0000 mg | ORAL_TABLET | Freq: Four times a day (QID) | ORAL | 0 refills | Status: DC | PRN

## 2020-05-06 MED ORDER — ALUM & MAG HYDROXIDE-SIMETH 200-200-20 MG/5ML PO SUSP: 15.0000 mL | ORAL | 0 refills | Status: DC | PRN

## 2020-05-06 MED ORDER — HYDROMORPHONE HCL 1 MG/ML IJ SOLN
0.5000 mg | INTRAMUSCULAR | Status: AC | PRN
  Administered 2020-05-06 (×2): 0.5 mg via INTRAVENOUS
  Filled 2020-05-06 (×2): qty 1

## 2020-05-06 MED ORDER — TRAMADOL HCL 50 MG PO TABS: 50.0000 mg | ORAL_TABLET | Freq: Four times a day (QID) | ORAL | 0 refills | Status: DC | PRN

## 2020-05-06 MED ORDER — CHLORHEXIDINE GLUCONATE CLOTH 2 % EX PADS: 6.0000 | MEDICATED_PAD | Freq: Every day | CUTANEOUS | Status: DC

## 2020-05-06 MED ORDER — DICYCLOMINE HCL 20 MG PO TABS: 20.0000 mg | ORAL_TABLET | Freq: Three times a day (TID) | ORAL | 0 refills | Status: DC | PRN

## 2020-05-06 MED ORDER — PANTOPRAZOLE SODIUM 40 MG PO TBEC: 40.0000 mg | DELAYED_RELEASE_TABLET | Freq: Every day | ORAL | 0 refills | Status: DC

## 2020-05-06 MED ORDER — PROCHLORPERAZINE EDISYLATE 10 MG/2ML IJ SOLN
10.0000 mg | Freq: Four times a day (QID) | INTRAMUSCULAR | Status: DC | PRN
  Administered 2020-05-06 (×2): 10 mg via INTRAVENOUS
  Filled 2020-05-06 (×2): qty 2

## 2020-05-06 NOTE — Progress Notes (Signed)
Pt IV removed, catheter intact. Pt has all belongings and understands d/c instructions. Pt d/c via wheelchair by volunteer services.  

## 2020-05-06 NOTE — Progress Notes (Deleted)
Patient ID: Tom Jones, male   DOB: May 08, 1995, 25 y.o.   MRN: [id number] Physician Discharge Summary  STANCIL DEISHER QIW:[id number] DOB: 1995-05-23 DOA: 05/04/2020  PCP: Arnette Felts, FNP  Admit date: 05/04/2020 Discharge date: 05/06/2020  Admitted From: Home Disposition: Home  Recommendations for Outpatient Follow-up:  1. Follow up with PCP in 1 week with repeat CBC/CMP 2. Comply with medications and follow-up 3. Abstain from marijuana.  Abstain from illicit drug use 4. Follow up in ED if symptoms worsen or new appear   Home Health: No Equipment/Devices: None  Discharge Condition: Stable CODE STATUS: Full Diet recommendation: Regular  Brief/Interim Summary: 25 year old male with no past medical history presented with abdominal pain, nausea and vomiting.  On presentation, creatinine was 3.2 with white count of 19,800.  CT of the abdomen and pelvis without contrast showed no acute pathology.  Renal ultrasound was negative for hydronephrosis.  He was admitted and started on IV fluids and pain medications.  During the hospitalization, his condition has improved.  Abdominal pain has improved.  He is tolerating diet.  Vital signs are stable and labs have improved.  He will be discharged home today with outpatient follow-up with PCP.  Discharge Diagnoses:   Acute renal failure Dehydration -Most likely prerenal from dehydration -Presented with creatinine of 3.2.  Treated with IV fluids.  CT of the abdomen and pelvis and renal ultrasound were negative for acute abnormality -Renal function has normalized and is 1.02 today.  Outpatient follow-up of BMP.  Abdominal pain -Questionable cause.  Imaging as above.  Abdominal pain has improved.  Currently tolerating diet.  Discharge patient home on oral Protonix, Bentyl and as needed tramadol.  Outpatient follow-up with GI if needed -There is a question of cyclical vomiting syndrome as well  Urine drug screen positive for tetrahydrocannabinol and  opiates -Patient confirms that he uses marijuana but denies any other illicit drug use.  Counseled regarding abstinence from marijuana as it can cause his GI symptoms.  Leukocytosis -Probably reactive.  Resolved.  Hypokalemia -Resolved  Hypercalcemia -Probably from dehydration.  Resolved   Discharge Instructions  Discharge Instructions    Diet general   Complete by: As directed    Increase activity slowly   Complete by: As directed      Allergies as of 05/06/2020   No Known Allergies     Medication List    STOP taking these medications   amitriptyline 25 MG tablet Commonly known as: ELAVIL   benzonatate 100 MG capsule Commonly known as: Tessalon Perles   metoCLOPramide 10 MG tablet Commonly known as: REGLAN   omeprazole 20 MG capsule Commonly known as: PRILOSEC   potassium chloride SA 20 MEQ tablet Commonly known as: KLOR-CON   sucralfate 1 g tablet Commonly known as: Carafate     TAKE these medications   albuterol 108 (90 Base) MCG/ACT inhaler Commonly known as: VENTOLIN HFA Inhale 2 puffs into the lungs every 6 (six) hours as needed for wheezing or shortness of breath.   alum & mag hydroxide-simeth 200-200-20 MG/5ML suspension Commonly known as: MAALOX/MYLANTA Take 15 mLs by mouth every 4 (four) hours as needed for indigestion or heartburn.   dicyclomine 20 MG tablet Commonly known as: BENTYL Take 1 tablet (20 mg total) by mouth 3 (three) times daily as needed for spasms. What changed:   when to take this  reasons to take this   ondansetron 4 MG tablet Commonly known as: ZOFRAN Take 1 tablet (4 mg total) by mouth  every 6 (six) hours as needed for nausea or vomiting.   pantoprazole 40 MG tablet Commonly known as: Protonix Take 1 tablet (40 mg total) by mouth daily.   traMADol 50 MG tablet Commonly known as: Ultram Take 1 tablet (50 mg total) by mouth every 6 (six) hours as needed for moderate pain.       Follow-up Information    Arnette Felts, FNP. Schedule an appointment as soon as possible for a visit in 1 week(s).   Specialty: General Practice Why: with repeat cbc/cmp  Contact information: [address] [place] [address] [phone]              No Known Allergies  Consultations:  None   Procedures/Studies: CT ABDOMEN PELVIS WO CONTRAST  Result Date: 05/05/2020 CLINICAL DATA:  Central abdominal pain for 1 day EXAM: CT ABDOMEN AND PELVIS WITHOUT CONTRAST TECHNIQUE: Multidetector CT imaging of the abdomen and pelvis was performed following the standard protocol without IV contrast. COMPARISON:  November 12, 2018 FINDINGS: Lower chest: The visualized heart size within normal limits. No pericardial fluid/thickening. No hiatal hernia. The visualized portions of the lungs are clear. Hepatobiliary: The liver is normal in density without focal abnormality.The main portal vein is patent. No evidence of calcified gallstones, gallbladder wall thickening or biliary dilatation. Pancreas: Unremarkable. No pancreatic ductal dilatation or surrounding inflammatory changes. Spleen: Normal in size without focal abnormality. Adrenals/Urinary Tract: Both adrenal glands appear normal. The kidneys and collecting system appear normal without evidence of urinary tract calculus or hydronephrosis. Bladder is unremarkable. Stomach/Bowel: The stomach, small bowel, and colon are normal in appearance. No inflammatory changes, wall thickening, or obstructive findings. There is a moderate amount of right colonic stool present.The appendix is normal. Vascular/Lymphatic: There are no enlarged mesenteric, retroperitoneal, or pelvic lymph nodes. No significant vascular findings are present. Reproductive: The prostate is unremarkable. Other: No evidence of abdominal wall mass or hernia. Musculoskeletal: No acute or significant osseous findings. IMPRESSION: No acute intra-abdominal or pelvic pathology to explain the patient's symptoms.  Moderate amount right colonic stool. Electronically Signed   By: Jonna Clark M.D.   On: 05/05/2020 00:21   US Renal  Result Date: 05/05/2020 CLINICAL DATA:  Acute renal failure EXAM: RENAL / URINARY TRACT ULTRASOUND COMPLETE COMPARISON:  None. FINDINGS: Right Kidney: Renal measurements: 9.6 x 4.9 x 5.3 cm = volume: 129 mL. Echogenicity within normal limits. No mass or hydronephrosis visualized. Left Kidney: Renal measurements: 10.7 x 6.1 x 4.2 cm = volume: 145.1 mL. Echogenicity within normal limits. No mass or hydronephrosis visualized. Bladder: Foley catheter in the bladder. Other: None. IMPRESSION: Normal renal ultrasound. Electronically Signed   By: Deatra Robinson M.D.   On: 05/05/2020 03:07       Subjective: Patient seen and examined at bedside.  Feels much better and thinks that he can go home today.  Abdominal pain is much better.  Tolerating diet.  No overnight fever, chest pain or shortness of breath reported.  Discharge Exam: Vitals:   05/06/20 0008 05/06/20 0434  BP: (!) 142/86 (!) 144/74  Pulse: 69 61  Resp: 18 18  Temp: 98.5 F (36.9 C) 98.7 F (37.1 C)  SpO2: 99% 98%    General: Pt is alert, awake, not in acute distress Cardiovascular: rate controlled, S1/S2 + Respiratory: bilateral decreased breath sounds at bases Abdominal: Soft, NT, ND, bowel sounds + Extremities: no edema, no cyanosis    The results of significant diagnostics from this hospitalization (including imaging, microbiology,  ancillary and laboratory) are listed below for reference.     Microbiology: Recent Results (from the past 240 hour(s))  Urine culture     Status: None   Collection Time: 05/05/20  1:28 AM   Specimen: Urine, Random  Result Value Ref Range Status   Specimen Description URINE, RANDOM  Final   Special Requests NONE  Final   Culture   Final    NO GROWTH Performed at [facility], [address] [place], [place] [address]    Report Status 05/06/2020 FINAL  Final  SARS  CORONAVIRUS 2 (TAT 6-24 HRS) Nasopharyngeal Nasopharyngeal Swab     Status: None   Collection Time: 05/05/20  1:33 AM   Specimen: Nasopharyngeal Swab  Result Value Ref Range Status   SARS Coronavirus 2 NEGATIVE NEGATIVE Final    Comment: (NOTE) SARS-CoV-2 target nucleic acids are NOT DETECTED.  The SARS-CoV-2 RNA is generally detectable in upper and lower respiratory specimens during the acute phase of infection. Negative results do not preclude SARS-CoV-2 infection, do not rule out co-infections with other pathogens, and should not be used as the sole basis for treatment or other patient management decisions. Negative results must be combined with clinical observations, patient history, and epidemiological information. The expected result is Negative.  Fact Sheet for Patients: HairSlick.no  Fact Sheet for Healthcare Providers: quierodirigir.com  This test is not yet approved or cleared by the [place] FDA and  has been authorized for detection and/or diagnosis of SARS-CoV-2 by FDA under an Emergency Use Authorization (EUA). This EUA will remain  in effect (meaning this test can be used) for the duration of the COVID-19 declaration under Se ction 564(b)(1) of the Act, 21 U.S.C. section 360bbb-3(b)(1), unless the authorization is terminated or revoked sooner.  Performed at [facility], [address] [place], [place] [address]      Labs: BNP (last 3 results) No results for input(s): BNP in the last 8760 hours. Basic Metabolic Panel: Recent Labs  Lab 05/04/20 2110 05/05/20 0633 05/06/20 0336  NA 135 136 136  K 3.4* 3.4* 3.9  CL 97* 97* 101  CO2 17* 23 25  GLUCOSE 119* 118* 102*  BUN 33* 36* 20  CREATININE 3.20* 1.67* 1.02  CALCIUM 11.3* 10.3 9.4  MG  --  2.2 2.1   Liver Function Tests: Recent Labs  Lab 05/04/20 2110 05/05/20 0633  AST 25 22  ALT 37 34  ALKPHOS 118 111  BILITOT 1.8* 2.5*   PROT 10.0* 9.1*  ALBUMIN 5.8* 4.9   Recent Labs  Lab 05/04/20 2110  LIPASE 21   No results for input(s): AMMONIA in the last 168 hours. CBC: Recent Labs  Lab 05/04/20 2110 05/05/20 0633 05/06/20 0336  WBC 19.8* 17.4* 10.1  NEUTROABS  --   --  7.4  HGB 18.8* 17.7* 15.2  HCT 51.2 50.3 45.2  MCV 86.9 88.7 90.4  PLT 303 250 210   Cardiac Enzymes: No results for input(s): CKTOTAL, CKMB, CKMBINDEX, TROPONINI in the last 168 hours. BNP: Invalid input(s): POCBNP CBG: No results for input(s): GLUCAP in the last 168 hours. D-Dimer No results for input(s): DDIMER in the last 72 hours. Hgb A1c No results for input(s): HGBA1C in the last 72 hours. Lipid Profile No results for input(s): CHOL, HDL, LDLCALC, TRIG, CHOLHDL, LDLDIRECT in the last 72 hours. Thyroid function studies No results for input(s): TSH, T4TOTAL, T3FREE, THYROIDAB in the last 72 hours.  Invalid input(s): FREET3 Anemia work up  No results for input(s): VITAMINB12, FOLATE, FERRITIN, TIBC, IRON, RETICCTPCT in the last 72 hours. Urinalysis    Component Value Date/Time   COLORURINE YELLOW 05/05/2020 0337   APPEARANCEUR HAZY (A) 05/05/2020 0337   LABSPEC >1.030 (H) 05/05/2020 0337   PHURINE 5.5 05/05/2020 0337   GLUCOSEU NEGATIVE 05/05/2020 0337   HGBUR NEGATIVE 05/05/2020 0337   BILIRUBINUR NEGATIVE 05/05/2020 0337   KETONESUR NEGATIVE 05/05/2020 0337   PROTEINUR 100 (A) 05/05/2020 0337   NITRITE NEGATIVE 05/05/2020 0337   LEUKOCYTESUR NEGATIVE 05/05/2020 2633   Sepsis Labs Invalid input(s): PROCALCITONIN,  WBC,  LACTICIDVEN Microbiology Recent Results (from the past 240 hour(s))  Urine culture     Status: None   Collection Time: 05/05/20  1:28 AM   Specimen: Urine, Random  Result Value Ref Range Status   Specimen Description URINE, RANDOM  Final   Special Requests NONE  Final   Culture   Final    NO GROWTH Performed at [facility], [address] [place], [place] [address]    Report  Status 05/06/2020 FINAL  Final  SARS CORONAVIRUS 2 (TAT 6-24 HRS) Nasopharyngeal Nasopharyngeal Swab     Status: None   Collection Time: 05/05/20  1:33 AM   Specimen: Nasopharyngeal Swab  Result Value Ref Range Status   SARS Coronavirus 2 NEGATIVE NEGATIVE Final    Comment: (NOTE) SARS-CoV-2 target nucleic acids are NOT DETECTED.  The SARS-CoV-2 RNA is generally detectable in upper and lower respiratory specimens during the acute phase of infection. Negative results do not preclude SARS-CoV-2 infection, do not rule out co-infections with other pathogens, and should not be used as the sole basis for treatment or other patient management decisions. Negative results must be combined with clinical observations, patient history, and epidemiological information. The expected result is Negative.  Fact Sheet for Patients: HairSlick.no  Fact Sheet for Healthcare Providers: quierodirigir.com  This test is not yet approved or cleared by the [place] FDA and  has been authorized for detection and/or diagnosis of SARS-CoV-2 by FDA under an Emergency Use Authorization (EUA). This EUA will remain  in effect (meaning this test can be used) for the duration of the COVID-19 declaration under Se ction 564(b)(1) of the Act, 21 U.S.C. section 360bbb-3(b)(1), unless the authorization is terminated or revoked sooner.  Performed at [facility], [address], [place], [place] [address]      Time coordinating discharge: 35 minutes  SIGNED:   Glade Lloyd, MD  [facility] 05/06/2020, 9:20 AM

## 2020-05-06 NOTE — Discharge Summary (Addendum)
Patient ID: Tom Jones, male   DOB: May 08, 1995, 25 y.o.   MRN: [id number] Physician Discharge Summary  Tom Jones:[id number] DOB: 1995-05-23 DOA: 05/04/2020  PCP: Arnette Felts, FNP  Admit date: 05/04/2020 Discharge date: 05/06/2020  Admitted From: Home Disposition: Home  Recommendations for Outpatient Follow-up:  1. Follow up with PCP in 1 week with repeat CBC/CMP 2. Comply with medications and follow-up 3. Abstain from marijuana.  Abstain from illicit drug use 4. Follow up in ED if symptoms worsen or new appear   Home Health: No Equipment/Devices: None  Discharge Condition: Stable CODE STATUS: Full Diet recommendation: Regular  Brief/Interim Summary: 25 year old male with no past medical history presented with abdominal pain, nausea and vomiting.  On presentation, creatinine was 3.2 with white count of 19,800.  CT of the abdomen and pelvis without contrast showed no acute pathology.  Renal ultrasound was negative for hydronephrosis.  He was admitted and started on IV fluids and pain medications.  During the hospitalization, his condition has improved.  Abdominal pain has improved.  He is tolerating diet.  Vital signs are stable and labs have improved.  He will be discharged home today with outpatient follow-up with PCP.  Discharge Diagnoses:   Acute renal failure Dehydration -Most likely prerenal from dehydration -Presented with creatinine of 3.2.  Treated with IV fluids.  CT of the abdomen and pelvis and renal ultrasound were negative for acute abnormality -Renal function has normalized and is 1.02 today.  Outpatient follow-up of BMP.  Abdominal pain -Questionable cause.  Imaging as above.  Abdominal pain has improved.  Currently tolerating diet.  Discharge patient home on oral Protonix, Bentyl and as needed tramadol.  Outpatient follow-up with GI if needed -There is a question of cyclical vomiting syndrome as well  Urine drug screen positive for tetrahydrocannabinol and  opiates -Patient confirms that he uses marijuana but denies any other illicit drug use.  Counseled regarding abstinence from marijuana as it can cause his GI symptoms.  Leukocytosis -Probably reactive.  Resolved.  Hypokalemia -Resolved  Hypercalcemia -Probably from dehydration.  Resolved   Discharge Instructions  Discharge Instructions    Diet general   Complete by: As directed    Increase activity slowly   Complete by: As directed      Allergies as of 05/06/2020   No Known Allergies     Medication List    STOP taking these medications   amitriptyline 25 MG tablet Commonly known as: ELAVIL   benzonatate 100 MG capsule Commonly known as: Tessalon Perles   metoCLOPramide 10 MG tablet Commonly known as: REGLAN   omeprazole 20 MG capsule Commonly known as: PRILOSEC   potassium chloride SA 20 MEQ tablet Commonly known as: KLOR-CON   sucralfate 1 g tablet Commonly known as: Carafate     TAKE these medications   albuterol 108 (90 Base) MCG/ACT inhaler Commonly known as: VENTOLIN HFA Inhale 2 puffs into the lungs every 6 (six) hours as needed for wheezing or shortness of breath.   alum & mag hydroxide-simeth 200-200-20 MG/5ML suspension Commonly known as: MAALOX/MYLANTA Take 15 mLs by mouth every 4 (four) hours as needed for indigestion or heartburn.   dicyclomine 20 MG tablet Commonly known as: BENTYL Take 1 tablet (20 mg total) by mouth 3 (three) times daily as needed for spasms. What changed:   when to take this  reasons to take this   ondansetron 4 MG tablet Commonly known as: ZOFRAN Take 1 tablet (4 mg total) by mouth  every 6 (six) hours as needed for nausea. What changed: reasons to take this   pantoprazole 40 MG tablet Commonly known as: Protonix Take 1 tablet (40 mg total) by mouth daily.   traMADol 50 MG tablet Commonly known as: Ultram Take 1 tablet (50 mg total) by mouth every 6 (six) hours as needed for moderate pain.        Consultations:  None   Procedures/Studies: CT ABDOMEN PELVIS WO CONTRAST  Result Date: 05/05/2020 CLINICAL DATA:  Central abdominal pain for 1 day EXAM: CT ABDOMEN AND PELVIS WITHOUT CONTRAST TECHNIQUE: Multidetector CT imaging of the abdomen and pelvis was performed following the standard protocol without IV contrast. COMPARISON:  November 12, 2018 FINDINGS: Lower chest: The visualized heart size within normal limits. No pericardial fluid/thickening. No hiatal hernia. The visualized portions of the lungs are clear. Hepatobiliary: The liver is normal in density without focal abnormality.The main portal vein is patent. No evidence of calcified gallstones, gallbladder wall thickening or biliary dilatation. Pancreas: Unremarkable. No pancreatic ductal dilatation or surrounding inflammatory changes. Spleen: Normal in size without focal abnormality. Adrenals/Urinary Tract: Both adrenal glands appear normal. The kidneys and collecting system appear normal without evidence of urinary tract calculus or hydronephrosis. Bladder is unremarkable. Stomach/Bowel: The stomach, small bowel, and colon are normal in appearance. No inflammatory changes, wall thickening, or obstructive findings. There is a moderate amount of right colonic stool present.The appendix is normal. Vascular/Lymphatic: There are no enlarged mesenteric, retroperitoneal, or pelvic lymph nodes. No significant vascular findings are present. Reproductive: The prostate is unremarkable. Other: No evidence of abdominal wall mass or hernia. Musculoskeletal: No acute or significant osseous findings. IMPRESSION: No acute intra-abdominal or pelvic pathology to explain the patient's symptoms. Moderate amount right colonic stool. Electronically Signed   By: Jonna Clark M.D.   On: 05/05/2020 00:21   US Renal  Result Date: 05/05/2020 CLINICAL DATA:  Acute renal failure EXAM: RENAL / URINARY TRACT ULTRASOUND COMPLETE COMPARISON:  None. FINDINGS: Right Kidney:  Renal measurements: 9.6 x 4.9 x 5.3 cm = volume: 129 mL. Echogenicity within normal limits. No mass or hydronephrosis visualized. Left Kidney: Renal measurements: 10.7 x 6.1 x 4.2 cm = volume: 145.1 mL. Echogenicity within normal limits. No mass or hydronephrosis visualized. Bladder: Foley catheter in the bladder. Other: None. IMPRESSION: Normal renal ultrasound. Electronically Signed   By: Deatra Robinson M.D.   On: 05/05/2020 03:07       Subjective: Patient seen and examined at bedside.  Feels much better and thinks that he can go home today.  Abdominal pain is much better.  Tolerating diet.  No overnight fever, chest pain or shortness of breath reported.  Discharge Exam: Vitals:   05/06/20 0008 05/06/20 0434  BP: (!) 142/86 (!) 144/74  Pulse: 69 61  Resp: 18 18  Temp: 98.5 F (36.9 C) 98.7 F (37.1 C)  SpO2: 99% 98%    General: Pt is alert, awake, not in acute distress Cardiovascular: rate controlled, S1/S2 + Respiratory: bilateral decreased breath sounds at bases Abdominal: Soft, NT, ND, bowel sounds + Extremities: no edema, no cyanosis    The results of significant diagnostics from this hospitalization (including imaging, microbiology, ancillary and laboratory) are listed below for reference.     Microbiology: Recent Results (from the past 240 hour(s))  Urine culture     Status: None   Collection Time: 05/05/20  1:28 AM   Specimen: Urine, Random  Result Value Ref Range Status   Specimen Description URINE, RANDOM  Final   Special Requests NONE  Final   Culture   Final    NO GROWTH Performed at [facility], [address], [place], [place] [address]    Report Status 05/06/2020 FINAL  Final  SARS CORONAVIRUS 2 (TAT 6-24 HRS) Nasopharyngeal Nasopharyngeal Swab     Status: None   Collection Time: 05/05/20  1:33 AM   Specimen: Nasopharyngeal Swab  Result Value Ref Range Status   SARS Coronavirus 2 NEGATIVE NEGATIVE Final    Comment: (NOTE) SARS-CoV-2 target  nucleic acids are NOT DETECTED.  The SARS-CoV-2 RNA is generally detectable in upper and lower respiratory specimens during the acute phase of infection. Negative results do not preclude SARS-CoV-2 infection, do not rule out co-infections with other pathogens, and should not be used as the sole basis for treatment or other patient management decisions. Negative results must be combined with clinical observations, patient history, and epidemiological information. The expected result is Negative.  Fact Sheet for Patients: HairSlick.no  Fact Sheet for Healthcare Providers: quierodirigir.com  This test is not yet approved or cleared by the [place] FDA and  has been authorized for detection and/or diagnosis of SARS-CoV-2 by FDA under an Emergency Use Authorization (EUA). This EUA will remain  in effect (meaning this test can be used) for the duration of the COVID-19 declaration under Se ction 564(b)(1) of the Act, 21 U.S.C. section 360bbb-3(b)(1), unless the authorization is terminated or revoked sooner.  Performed at [facility], [address], [place], [place] [address]      Labs: BNP (last 3 results) No results for input(s): BNP in the last 8760 hours. Basic Metabolic Panel: Recent Labs  Lab 05/04/20 2110 05/05/20 0633 05/06/20 0336  NA 135 136 136  K 3.4* 3.4* 3.9  CL 97* 97* 101  CO2 17* 23 25  GLUCOSE 119* 118* 102*  BUN 33* 36* 20  CREATININE 3.20* 1.67* 1.02  CALCIUM 11.3* 10.3 9.4  MG  --  2.2 2.1   Liver Function Tests: Recent Labs  Lab 05/04/20 2110 05/05/20 0633  AST 25 22  ALT 37 34  ALKPHOS 118 111  BILITOT 1.8* 2.5*  PROT 10.0* 9.1*  ALBUMIN 5.8* 4.9   Recent Labs  Lab 05/04/20 2110  LIPASE 21   No results for input(s): AMMONIA in the last 168 hours. CBC: Recent Labs  Lab 05/04/20 2110 05/05/20 0633 05/06/20 0336  WBC 19.8* 17.4* 10.1  NEUTROABS  --   --  7.4  HGB  18.8* 17.7* 15.2  HCT 51.2 50.3 45.2  MCV 86.9 88.7 90.4  PLT 303 250 210   Cardiac Enzymes: No results for input(s): CKTOTAL, CKMB, CKMBINDEX, TROPONINI in the last 168 hours. BNP: Invalid input(s): POCBNP CBG: No results for input(s): GLUCAP in the last 168 hours. D-Dimer No results for input(s): DDIMER in the last 72 hours. Hgb A1c No results for input(s): HGBA1C in the last 72 hours. Lipid Profile No results for input(s): CHOL, HDL, LDLCALC, TRIG, CHOLHDL, LDLDIRECT in the last 72 hours. Thyroid function studies No results for input(s): TSH, T4TOTAL, T3FREE, THYROIDAB in the last 72 hours.  Invalid input(s): FREET3 Anemia work up No results for input(s): VITAMINB12, FOLATE, FERRITIN, TIBC, IRON, RETICCTPCT in the last 72 hours. Urinalysis    Component Value Date/Time   COLORURINE YELLOW 05/05/2020 0337   APPEARANCEUR HAZY (A) 05/05/2020 0337   LABSPEC >1.030 (H) 05/05/2020 0337   PHURINE 5.5 05/05/2020 0337   GLUCOSEU NEGATIVE 05/05/2020 5638  HGBUR NEGATIVE 05/05/2020 0337   BILIRUBINUR NEGATIVE 05/05/2020 0337   KETONESUR NEGATIVE 05/05/2020 0337   PROTEINUR 100 (A) 05/05/2020 0337   NITRITE NEGATIVE 05/05/2020 0337   LEUKOCYTESUR NEGATIVE 05/05/2020 0454   Sepsis Labs Invalid input(s): PROCALCITONIN,  WBC,  LACTICIDVEN Microbiology Recent Results (from the past 240 hour(s))  Urine culture     Status: None   Collection Time: 05/05/20  1:28 AM   Specimen: Urine, Random  Result Value Ref Range Status   Specimen Description URINE, RANDOM  Final   Special Requests NONE  Final   Culture   Final    NO GROWTH Performed at [facility], [address], [place], [place] [address]    Report Status 05/06/2020 FINAL  Final  SARS CORONAVIRUS 2 (TAT 6-24 HRS) Nasopharyngeal Nasopharyngeal Swab     Status: None   Collection Time: 05/05/20  1:33 AM   Specimen: Nasopharyngeal Swab  Result Value Ref Range Status   SARS Coronavirus 2 NEGATIVE NEGATIVE Final     Comment: (NOTE) SARS-CoV-2 target nucleic acids are NOT DETECTED.  The SARS-CoV-2 RNA is generally detectable in upper and lower respiratory specimens during the acute phase of infection. Negative results do not preclude SARS-CoV-2 infection, do not rule out co-infections with other pathogens, and should not be used as the sole basis for treatment or other patient management decisions. Negative results must be combined with clinical observations, patient history, and epidemiological information. The expected result is Negative.  Fact Sheet for Patients: HairSlick.no  Fact Sheet for Healthcare Providers: quierodirigir.com  This test is not yet approved or cleared by the [place] FDA and  has been authorized for detection and/or diagnosis of SARS-CoV-2 by FDA under an Emergency Use Authorization (EUA). This EUA will remain  in effect (meaning this test can be used) for the duration of the COVID-19 declaration under Se ction 564(b)(1) of the Act, 21 U.S.C. section 360bbb-3(b)(1), unless the authorization is terminated or revoked sooner.  Performed at [facility], [address], [place], [place] [address]      Time coordinating discharge: 35 minutes  SIGNED:   Glade Lloyd, MD  [facility] 05/06/2020, 9:20 AM

## 2020-05-10 ENCOUNTER — Emergency Department (HOSPITAL_COMMUNITY): Payer: Self-pay

## 2020-05-10 ENCOUNTER — Telehealth: Payer: Self-pay

## 2020-05-10 ENCOUNTER — Other Ambulatory Visit: Payer: Self-pay

## 2020-05-10 ENCOUNTER — Encounter (HOSPITAL_COMMUNITY): Payer: Self-pay | Admitting: Emergency Medicine

## 2020-05-10 ENCOUNTER — Inpatient Hospital Stay (HOSPITAL_COMMUNITY)
Admission: EM | Admit: 2020-05-10 | Discharge: 2020-05-12 | DRG: 683 | Disposition: A | Discharge status: Home / Self Care | Payer: Self-pay | Attending: Internal Medicine | Admitting: Internal Medicine

## 2020-05-10 DIAGNOSIS — Z79899 Other long term (current) drug therapy: Secondary | ICD-10-CM

## 2020-05-10 DIAGNOSIS — F1210 Cannabis abuse, uncomplicated: Secondary | ICD-10-CM | POA: Diagnosis present

## 2020-05-10 DIAGNOSIS — E876 Hypokalemia: Secondary | ICD-10-CM | POA: Diagnosis present

## 2020-05-10 DIAGNOSIS — R739 Hyperglycemia, unspecified: Secondary | ICD-10-CM | POA: Diagnosis present

## 2020-05-10 DIAGNOSIS — R1010 Upper abdominal pain, unspecified: Secondary | ICD-10-CM

## 2020-05-10 DIAGNOSIS — R112 Nausea with vomiting, unspecified: Secondary | ICD-10-CM | POA: Diagnosis present

## 2020-05-10 DIAGNOSIS — R7401 Elevation of levels of liver transaminase levels: Secondary | ICD-10-CM | POA: Diagnosis present

## 2020-05-10 DIAGNOSIS — K219 Gastro-esophageal reflux disease without esophagitis: Secondary | ICD-10-CM | POA: Diagnosis present

## 2020-05-10 DIAGNOSIS — N179 Acute kidney failure, unspecified: Principal | ICD-10-CM | POA: Diagnosis present

## 2020-05-10 DIAGNOSIS — E878 Other disorders of electrolyte and fluid balance, not elsewhere classified: Secondary | ICD-10-CM | POA: Diagnosis present

## 2020-05-10 DIAGNOSIS — Z20822 Contact with and (suspected) exposure to covid-19: Secondary | ICD-10-CM | POA: Diagnosis present

## 2020-05-10 DIAGNOSIS — R109 Unspecified abdominal pain: Secondary | ICD-10-CM | POA: Diagnosis present

## 2020-05-10 DIAGNOSIS — E43 Unspecified severe protein-calorie malnutrition: Secondary | ICD-10-CM | POA: Insufficient documentation

## 2020-05-10 DIAGNOSIS — F17290 Nicotine dependence, other tobacco product, uncomplicated: Secondary | ICD-10-CM | POA: Diagnosis present

## 2020-05-10 DIAGNOSIS — R9431 Abnormal electrocardiogram [ECG] [EKG]: Secondary | ICD-10-CM | POA: Diagnosis present

## 2020-05-10 DIAGNOSIS — R1084 Generalized abdominal pain: Secondary | ICD-10-CM | POA: Diagnosis present

## 2020-05-10 DIAGNOSIS — E871 Hypo-osmolality and hyponatremia: Secondary | ICD-10-CM | POA: Diagnosis present

## 2020-05-10 DIAGNOSIS — E860 Dehydration: Secondary | ICD-10-CM | POA: Diagnosis present

## 2020-05-10 LAB — URINALYSIS, MICROSCOPIC (REFLEX)

## 2020-05-10 LAB — CBC
HCT: 49.4 % (ref 39.0–52.0)
Hemoglobin: 18.2 g/dL — ABNORMAL HIGH (ref 13.0–17.0)
MCH: 31.1 pg (ref 26.0–34.0)
MCHC: 36.8 g/dL — ABNORMAL HIGH (ref 30.0–36.0)
MCV: 84.3 fL (ref 80.0–100.0)
Platelets: 379 10*3/uL (ref 150–400)
RBC: 5.86 MIL/uL — ABNORMAL HIGH (ref 4.22–5.81)
RDW: 11.2 % — ABNORMAL LOW (ref 11.5–15.5)
WBC: 7.9 10*3/uL (ref 4.0–10.5)
nRBC: 0 % (ref 0.0–0.2)

## 2020-05-10 LAB — COMPREHENSIVE METABOLIC PANEL
ALT: 54 U/L — ABNORMAL HIGH (ref 0–44)
AST: 44 U/L — ABNORMAL HIGH (ref 15–41)
Albumin: 4.9 g/dL (ref 3.5–5.0)
Alkaline Phosphatase: 108 U/L (ref 38–126)
Anion gap: 17 — ABNORMAL HIGH (ref 5–15)
BUN: 57 mg/dL — ABNORMAL HIGH (ref 6–20)
CO2: 26 mmol/L (ref 22–32)
Calcium: 9.6 mg/dL (ref 8.9–10.3)
Chloride: 85 mmol/L — ABNORMAL LOW (ref 98–111)
Creatinine, Ser: 1.74 mg/dL — ABNORMAL HIGH (ref 0.61–1.24)
GFR, Estimated: 56 mL/min — ABNORMAL LOW (ref >60)
Glucose, Bld: 158 mg/dL — ABNORMAL HIGH (ref 70–99)
Potassium: 3 mmol/L — ABNORMAL LOW (ref 3.5–5.1)
Sodium: 128 mmol/L — ABNORMAL LOW (ref 135–145)
Total Bilirubin: 2.5 mg/dL — ABNORMAL HIGH (ref 0.3–1.2)
Total Protein: 8.9 g/dL — ABNORMAL HIGH (ref 6.5–8.1)

## 2020-05-10 LAB — HEPATITIS PANEL, ACUTE
HCV Ab: NONREACTIVE
Hep A IgM: NONREACTIVE
Hep B C IgM: NONREACTIVE
Hepatitis B Surface Ag: NONREACTIVE

## 2020-05-10 LAB — ACETAMINOPHEN LEVEL: Acetaminophen (Tylenol), Serum: <10 ug/mL — ABNORMAL LOW (ref 10–30)

## 2020-05-10 LAB — URINALYSIS, ROUTINE W REFLEX MICROSCOPIC
Bilirubin Urine: NEGATIVE
Glucose, UA: NEGATIVE mg/dL
Hgb urine dipstick: NEGATIVE
Ketones, ur: 15 mg/dL — AB
Leukocytes,Ua: NEGATIVE
Nitrite: NEGATIVE
Protein, ur: 30 mg/dL — AB
Specific Gravity, Urine: 1.025 (ref 1.005–1.030)
pH: 6 (ref 5.0–8.0)

## 2020-05-10 LAB — BASIC METABOLIC PANEL WITH GFR
Anion gap: 13 (ref 5–15)
BUN: 36 mg/dL — ABNORMAL HIGH (ref 6–20)
CO2: 28 mmol/L (ref 22–32)
Calcium: 9.1 mg/dL (ref 8.9–10.3)
Chloride: 90 mmol/L — ABNORMAL LOW (ref 98–111)
Creatinine, Ser: 1.06 mg/dL (ref 0.61–1.24)
GFR, Estimated: >60 mL/min (ref >60)
Glucose, Bld: 113 mg/dL — ABNORMAL HIGH (ref 70–99)
Potassium: 3.3 mmol/L — ABNORMAL LOW (ref 3.5–5.1)
Sodium: 131 mmol/L — ABNORMAL LOW (ref 135–145)

## 2020-05-10 LAB — BILIRUBIN, DIRECT: Bilirubin, Direct: 0.3 mg/dL — ABNORMAL HIGH (ref 0.0–0.2)

## 2020-05-10 LAB — BILIRUBIN, FRACTIONATED(TOT/DIR/INDIR)
Bilirubin, Direct: 0.2 mg/dL (ref 0.0–0.2)
Indirect Bilirubin: 2.1 mg/dL — ABNORMAL HIGH (ref 0.3–0.9)
Total Bilirubin: 2.3 mg/dL — ABNORMAL HIGH (ref 0.3–1.2)

## 2020-05-10 LAB — LIPASE, BLOOD: Lipase: 97 U/L — ABNORMAL HIGH (ref 11–51)

## 2020-05-10 LAB — SARS CORONAVIRUS 2 (TAT 6-24 HRS): SARS Coronavirus 2: NEGATIVE

## 2020-05-10 LAB — SALICYLATE LEVEL: Salicylate Lvl: <7 mg/dL — ABNORMAL LOW (ref 7.0–30.0)

## 2020-05-10 LAB — GAMMA GT: GGT: 68 U/L — ABNORMAL HIGH (ref 7–50)

## 2020-05-10 MED ORDER — CAPSAICIN 0.025 % EX CREA
TOPICAL_CREAM | Freq: Two times a day (BID) | CUTANEOUS | Status: DC
  Filled 2020-05-10: qty 60

## 2020-05-10 MED ORDER — MORPHINE SULFATE (PF) 2 MG/ML IV SOLN
2.0000 mg | INTRAVENOUS | Status: DC | PRN
  Administered 2020-05-11: 2 mg via INTRAVENOUS
  Filled 2020-05-10: qty 1

## 2020-05-10 MED ORDER — SCOPOLAMINE 1 MG/3DAYS TD PT72
1.0000 | MEDICATED_PATCH | TRANSDERMAL | Status: DC
  Administered 2020-05-10: 1.5 mg via TRANSDERMAL
  Filled 2020-05-10: qty 1

## 2020-05-10 MED ORDER — ONDANSETRON 4 MG PO TBDP
8.0000 mg | ORAL_TABLET | Freq: Once | ORAL | Status: AC
  Administered 2020-05-10: 8 mg via ORAL
  Filled 2020-05-10: qty 2

## 2020-05-10 MED ORDER — SODIUM CHLORIDE 0.9 % IV BOLUS
1000.0000 mL | Freq: Once | INTRAVENOUS | Status: AC
  Administered 2020-05-10: 1000 mL via INTRAVENOUS

## 2020-05-10 MED ORDER — MORPHINE SULFATE (PF) 2 MG/ML IV SOLN
2.0000 mg | Freq: Once | INTRAVENOUS | Status: AC
  Administered 2020-05-10: 2 mg via INTRAVENOUS
  Filled 2020-05-10: qty 1

## 2020-05-10 MED ORDER — SODIUM CHLORIDE 0.9 % IV SOLN: Freq: Once | INTRAVENOUS | Status: AC

## 2020-05-10 MED ORDER — SODIUM CHLORIDE 0.45 % IV SOLN
INTRAVENOUS | Status: AC
  Filled 2020-05-10 (×4): qty 1000

## 2020-05-10 MED ORDER — POTASSIUM CHLORIDE 10 MEQ/100ML IV SOLN
10.0000 meq | Freq: Once | INTRAVENOUS | Status: AC
  Administered 2020-05-10: 10 meq via INTRAVENOUS
  Filled 2020-05-10: qty 100

## 2020-05-10 MED ORDER — SODIUM CHLORIDE 0.9% FLUSH
3.0000 mL | Freq: Two times a day (BID) | INTRAVENOUS | Status: DC
  Administered 2020-05-11 – 2020-05-12 (×2): 3 mL via INTRAVENOUS

## 2020-05-10 MED ORDER — ENOXAPARIN SODIUM 40 MG/0.4ML ~~LOC~~ SOLN
40.0000 mg | SUBCUTANEOUS | Status: DC
  Administered 2020-05-10 – 2020-05-12 (×2): 40 mg via SUBCUTANEOUS
  Filled 2020-05-10 (×2): qty 0.4

## 2020-05-10 MED ORDER — ACETAMINOPHEN 650 MG RE SUPP: 650.0000 mg | Freq: Four times a day (QID) | RECTAL | Status: DC | PRN

## 2020-05-10 MED ORDER — METOPROLOL TARTRATE 5 MG/5ML IV SOLN: 5.0000 mg | Freq: Four times a day (QID) | INTRAVENOUS | Status: DC | PRN

## 2020-05-10 MED ORDER — ACETAMINOPHEN 325 MG PO TABS: 650.0000 mg | ORAL_TABLET | Freq: Four times a day (QID) | ORAL | Status: DC | PRN

## 2020-05-10 MED ORDER — ALBUTEROL SULFATE (2.5 MG/3ML) 0.083% IN NEBU: 2.5000 mg | INHALATION_SOLUTION | Freq: Four times a day (QID) | RESPIRATORY_TRACT | Status: DC | PRN

## 2020-05-10 NOTE — Telephone Encounter (Signed)
Patient Is back in the hospital so I am unable to schedule him a ER f/u at this time. YL,RMA

## 2020-05-10 NOTE — ED Notes (Signed)
Pt transported to CT ?

## 2020-05-10 NOTE — Consult Note (Addendum)
Referring Provider:  [facility]         Primary Care Physician:  Tom Brine, FNP Primary Gastroenterologist: unassigned            We were asked to see this patient for:  Nausea , vomiting and abdominal pain                 ASSESSMENT / PLAN:   # 25 yo male readmitted with upper abdominal pain, nausea, vomiting, AKI, and electrolyte imbalances. (first admission was on 05/05/20). Two non-contrast CT scan in last week have been unrevealing. Had similar symptoms Aug 2020 and two CT scan w/ contrast were negative at that time. Etiology of symptoms unclear but nausea and vomiting could be secondary to frequent cannabis use,  --To exclude etiologies other than THC will schedule patient for EGD to be sometime tomorrow ( assuming electrolyte levels have improved). The risks and benefits of EGD were discussed and the patient agrees to proceed.  --If EGD negative consider checking cortisol level  # Prolonged QT. If EGD normal then can hopefully stop his PPI in setting of prolonged QT interval.   # Mild hyperbilirubinemia, predominantly indirect. Normal alk phos and only mildly elevated liver enzymes. No hepatobiliary findings on imaging.     ADDENDUM:  RN asked me about adding another anti-emetic. Patient is actively retching. I suspect that he is only on scopolamine due to prolonged QT.  I will contact Tom Jones about rechecking electrolytes to see if improved and is so then maybe add Zofran since he is being monitored on Telemetry    Attending Physician Note   I have taken Jones history, examined the patient and reviewed the chart. I agree with the Advanced Practitioner's note, impression and recommendations.  Recurrent N/V, upper abdominal pain with AKI and electrolyte abnormalities. CT AP unremarkable. Minimal AST, ALT elevation and mild indirect hyperbilirubinemia.  R/O GERD, ulcer, cannabis hyperemesis.   IV fluids, correct electrolytes, trend LFTs, EGD tomorrow at 1600.  Scopolamine is  not adequately controlling nausea, vomiting.  Zofran added with telemetry monitoring, given prolonged QTc.   Tom Edward, MD FACG [phone]      HPI:                                                                                                                             Chief Complaint: nausea, vomiting, abdominal pain   Tom Jones is Jones 25 y.o. male with no significant medical history   Patient saw PCP and went to ED in Aug 2020 for nausea and vomiting of relatively new onset.. Symtoms felt to be secondary to Tom Jones use.  CT scan  X 2 around that time were negative for acute abnormalties. Labs including liver chemistries, lipase were normal at that time. He did have elevated WBC of 13.5. Patient says he gave himself an enema for cleansing and slowly got better. He was okay until several days ago   Patient  was in ED 05/05/20 with non-radiating upper abdominal pain, nausea and vomiting. The pain is achy, gets worse after eating. He had AKI, WBC of 19.8. Renal US negative.  Non-contrast CT scan negative. UDS was positive for THC and opiates. He confirmed use of THC but no illicit drug use.  Discharged home 2/11 after treatment for dehydration. Sent home with zofran, pantoprazole, bentyl, and tramadol.   Patient came back to ED this am with ongoing upper abdominal pain, nausea and vomiting. Jones hot shower helps symptoms. He has recurrent AKI. Lipase mildly elevated and mild elevation in liver enzymes. He has indirect hyperbilirubinemia. Na + 128, K+ 3. WBC 7.9, Hgb 18. Repeat non-contrast CT scan negative. Tom Jones uses THC daily. He takes no herbs, supplements and denies illicit drug use. He doesn't take NSAIDS. He hasn't had any black stools or hematemesis. He hasn't been having regular BMs but attributes that to diminished PO intake. He reports Jones recent unintentional weight loss of 20 pounds.    EKG Interpretation    Date/Time:       Tuesday May 10 2020 11:33:30 EST        Text  Interpretation:       Normal sinus rhythm Right atrial enlargement Rightward axis Prolonged QT Abnormal ECG Reconfirmed by Tom Jones [phone]) on 05/10/2020 12:42:30 PM     Past Medical History:  Diagnosis Date  . Closed fibular fracture 05/07/2012   right distal fib. - sledding accident  . Seizures [facility]) age 63   febrile seizure x 1    Past Surgical History:  Procedure Laterality Date  . ORIF FIBULA FRACTURE Right 05/19/2012   Procedure: OPEN REDUCTION INTERNAL FIXATION (ORIF) FIBULA FRACTURE;  Surgeon: Tom Junes, MD;  Location: [place];  Service: Orthopedics;  Laterality: Right;    Prior to Admission medications   Medication Sig Start Date End Date Taking? Authorizing Provider  alum & mag hydroxide-simeth (MAALOX/MYLANTA) 200-200-20 MG/5ML suspension Take 15 mLs by mouth every 4 (four) hours as needed for indigestion or heartburn. 05/06/20  Yes Tom August, MD  dicyclomine (BENTYL) 20 MG tablet Take 1 tablet (20 mg total) by mouth 3 (three) times daily as needed for spasms. 05/06/20  Yes Tom August, MD  ondansetron (ZOFRAN) 4 MG tablet Take 1 tablet (4 mg total) by mouth every 6 (six) hours as needed for nausea. 05/06/20  Yes Tom August, MD  pantoprazole (PROTONIX) 40 MG tablet Take 1 tablet (40 mg total) by mouth daily. 05/06/20 06/05/20 Yes Tom August, MD  traMADol (ULTRAM) 50 MG tablet Take 1 tablet (50 mg total) by mouth every 6 (six) hours as needed for moderate pain. 05/06/20  Yes Tom August, MD  albuterol (VENTOLIN HFA) 108 (90 Base) MCG/ACT inhaler Inhale 2 puffs into the lungs every 6 (six) hours as needed for wheezing or shortness of breath. Patient not taking: No sig reported 03/15/19   Tom Balloon, FNP    Current Facility-Administered Medications  Medication Dose Route Frequency Provider Last Rate Last Admin  . 0.9 %  sodium chloride infusion   Intravenous Once Tom Plan A, MD      . acetaminophen (TYLENOL) tablet 650 mg   650 mg Oral Q6H PRN Tom Plan A, MD       Or  . acetaminophen (TYLENOL) suppository 650 mg  650 mg Rectal Q6H PRN Smith, Rondell A, MD      . albuterol (PROVENTIL) (2.5 MG/3ML) 0.083% nebulizer solution 2.5 mg  2.5 mg Nebulization Q6H  PRN Tom Plan A, MD      . enoxaparin (LOVENOX) injection 40 mg  40 mg Subcutaneous Q24H Smith, Rondell A, MD      . metoprolol tartrate (LOPRESSOR) injection 5 mg  5 mg Intravenous Q6H PRN Smith, Rondell A, MD      . morphine 2 MG/ML injection 2 mg  2 mg Intravenous Q3H PRN Smith, Rondell A, MD      . scopolamine (TRANSDERM-SCOP) 1 MG/3DAYS 1.5 mg  1 patch Transdermal Q72H Smith, Rondell A, MD      . sodium chloride 0.45 % 1,000 mL with potassium chloride 40 mEq infusion   Intravenous Continuous Tom Plan A, MD 125 mL/hr at 05/10/20 1335 New Bag at 05/10/20 1335  . sodium chloride flush (NS) 0.9 % injection 3 mL  3 mL Intravenous Q12H Norval Morton, MD        Allergies as of 05/10/2020  . (No Known Allergies)    Family History  Problem Relation Age of Onset  . Diabetes Maternal Aunt        2 aunts  . Hypertension Maternal Aunt   . Heart disease Maternal Aunt        valve replacement  . Diabetes Maternal Grandmother     Social History   Socioeconomic History  . Marital status: Single    Spouse name: Not on file  . Number of children: Not on file  . Years of education: Not on file  . Highest education level: Not on file  Occupational History  . Occupation: Ship broker  Tobacco Use  . Smoking status: Current Some Day Smoker    Types: E-cigarettes  . Smokeless tobacco: Never Used  . Tobacco comment: mother smokes outside  Vaping Use  . Vaping Use: Some days  Substance and Sexual Activity  . Alcohol use: No  . Drug use: No  . Sexual activity: Not on file  Other Topics Concern  . Not on file  Social History Narrative  . Not on file   Social Determinants of Health   Financial Resource Strain: Not on file  Food Insecurity:  Not on file  Transportation Needs: Not on file  Physical Activity: Not on file  Stress: Not on file  Social Connections: Not on file  Intimate Partner Violence: Not on file    Review of Systems: All systems reviewed and negative except where noted in HPI.  OBJECTIVE:    Physical Exam: Vital signs in last 24 hours: Temp:  [97.6 F (36.4 C)-98.6 F (37 C)] 98.6 F (37 C) (02/15 1400) Pulse Rate:  [71-109] 100 (02/15 1400) Resp:  [16-20] 16 (02/15 1400) BP: (97-142)/(72-94) 142/94 (02/15 1400) SpO2:  [93 %-100 %] 100 % (02/15 1400) Weight:  [82 kg] 82 kg (02/15 6283)   General:   Alert  male in NAD Psych:  Pleasant, cooperative. Normal mood and affect. Eyes:  Pupils equal, sclera clear, no icterus.   Conjunctiva pink. Ears:  Normal auditory acuity. Nose:  No deformity, discharge,  or lesions. Neck:  Supple; no masses Lungs:  Clear throughout to auscultation.   No wheezes, crackles, or rhonchi.  Heart:  Regular rate and rhythm; no murmurs, no lower extremity edema Abdomen:  Soft, non-distended, nontender, BS active, no palp mass   Rectal:  Deferred  Msk:  Symmetrical without gross deformities. . Neurologic:  Alert and  oriented x4;  grossly normal neurologically. Skin:  Intact without significant lesions or rashes.  Filed Weights   05/10/20 0621  Weight:  82 kg     Scheduled inpatient medications . enoxaparin (LOVENOX) injection  40 mg Subcutaneous Q24H  . scopolamine  1 patch Transdermal Q72H  . sodium chloride flush  3 mL Intravenous Q12H      Intake/Output from previous day: No intake/output data recorded. Intake/Output this shift: No intake/output data recorded.   Lab Results: Recent Labs    05/10/20 0623  WBC 7.9  HGB 18.2*  HCT 49.4  PLT 379   BMET Recent Labs    05/10/20 0623  NA 128*  K 3.0*  CL 85*  CO2 26  GLUCOSE 158*  BUN 57*  CREATININE 1.74*  CALCIUM 9.6   LFT Recent Labs    05/10/20 0623 05/10/20 1201  PROT 8.9*  --    ALBUMIN 4.9  --   AST 44*  --   ALT 54*  --   ALKPHOS 108  --   BILITOT 2.5* 2.3*  BILIDIR  --  0.2  IBILI  --  2.1*   PT/INR No results for input(s): LABPROT, INR in the last 72 hours. Hepatitis Panel Recent Labs    05/10/20 1201  HEPBSAG NON REACTIVE  HCVAB PENDING  HEPAIGM PENDING  HEPBIGM PENDING     . CBC Latest Ref Rng & Units 05/10/2020 05/06/2020 05/05/2020  WBC 4.0 - 10.5 K/uL 7.9 10.1 17.4(H)  Hemoglobin 13.0 - 17.0 g/dL 18.2(H) 15.2 17.7(H)  Hematocrit 39.0 - 52.0 % 49.4 45.2 50.3  Platelets 150 - 400 K/uL 379 210 250    . CMP Latest Ref Rng & Units 05/10/2020 05/10/2020 05/06/2020  Glucose 70 - 99 mg/dL - 158(H) 102(H)  BUN 6 - 20 mg/dL - 57(H) 20  Creatinine 0.61 - 1.24 mg/dL - 1.74(H) 1.02  Sodium 135 - 145 mmol/L - 128(L) 136  Potassium 3.5 - 5.1 mmol/L - 3.0(L) 3.9  Chloride 98 - 111 mmol/L - 85(L) 101  CO2 22 - 32 mmol/L - 26 25  Calcium 8.9 - 10.3 mg/dL - 9.6 9.4  Total Protein 6.5 - 8.1 g/dL - 8.9(H) -  Total Bilirubin 0.3 - 1.2 mg/dL 2.3(H) 2.5(H) -  Alkaline Phos 38 - 126 U/L - 108 -  AST 15 - 41 U/L - 44(H) -  ALT 0 - 44 U/L - 54(H) -   Studies/Results: CT ABDOMEN PELVIS WO CONTRAST  Result Date: 05/10/2020 CLINICAL DATA:  Nausea and vomiting with abdominal pain EXAM: CT ABDOMEN AND PELVIS WITHOUT CONTRAST TECHNIQUE: Multidetector CT imaging of the abdomen and pelvis was performed following the standard protocol without IV contrast. COMPARISON:  05/05/2020 FINDINGS: Lower chest:  No contributory findings. Hepatobiliary: No focal liver abnormality.No evidence of biliary obstruction or stone. Pancreas: Unremarkable. Spleen: Unremarkable. Adrenals/Urinary Tract: Negative adrenals. No hydronephrosis or stone. Unremarkable bladder. Stomach/Bowel:  No obstruction. No appendicitis. Vascular/Lymphatic: No acute vascular abnormality. No mass or adenopathy. Reproductive:No pathologic findings. Other: No ascites or pneumoperitoneum. Musculoskeletal: No acute  abnormalities. IMPRESSION: Negative abdominal CT. Electronically Signed   By: Monte Fantasia M.D.   On: 05/10/2020 11:19    Principal Problem:   Intractable nausea and vomiting Active Problems:   AKI (acute kidney injury) ([place])   Abdominal pain   Marijuana abuse   Hypokalemia   Prolonged QT interval   Hyponatremia   Hyperglycemia   Transaminitis   Hyperbilirubinemia    Tye Savoy, NP-C @  05/10/2020, 2:36 PM

## 2020-05-10 NOTE — ED Provider Notes (Signed)
[facility] Provider Note   CSN: [id number] Arrival date & time: 05/10/20  [id number]     History Chief Complaint  Patient presents with  . Abdominal Pain    Tom Jones is a 25 y.o. male.  Patient presents with abdominal pain and nausea vomiting with inability to keep down solid foods that has been distant since his discharge from the hospital for same symptoms 4 days ago.  States that the reason he felt better at time of discharge last week was because he was not anything in the hospital.  When he tried to eat food when he went back home he vomited it back up.  Gave himself an enema on Friday because he had not had a bowel movement and said that helped for that day, but symptoms worsen again the next day.  Has not had a bowel movement since that time.  States he can keep fluids down but is urinating "less" than he expects to given the amount of fluids he has been having.  Has not had any marijuana since his discharge.  Has no significant past medical history.  States this did occur in 2020 for 2 weeks and no cause was found at that time.  Symptoms spontaneously resolved at that time.  Also endorses dizziness and weakness due to not being able to eat.  Denies cough, diarrhea, headache, chest pain.        Past Medical History:  Diagnosis Date  . Closed fibular fracture 05/07/2012   right distal fib. - sledding accident  . Seizures [facility]) age 3   febrile seizure x 1    Patient Active Problem List   Diagnosis Date Noted  . Intractable nausea and vomiting 05/10/2020  . ARF (acute renal failure) (HCC) 05/05/2020  . Abdominal pain 05/05/2020  . Hypercalcemia 05/05/2020  . Leukocytosis 05/05/2020  . History of marijuana use 11/04/2018  . Intractable vomiting 11/04/2018  . Closed fibular fracture   . Concussion without loss of consciousness 12/21/2010    Past Surgical History:  Procedure Laterality Date  . ORIF FIBULA FRACTURE Right 05/19/2012    Procedure: OPEN REDUCTION INTERNAL FIXATION (ORIF) FIBULA FRACTURE;  Surgeon: Nilda Simmerobert A Wainer, MD;  Location: [facility];  Service: Orthopedics;  Laterality: Right;       Family History  Problem Relation Age of Onset  . Diabetes Maternal Aunt        2 aunts  . Hypertension Maternal Aunt   . Heart disease Maternal Aunt        valve replacement  . Diabetes Maternal Grandmother     Social History   Tobacco Use  . Smoking status: Current Some Day Smoker    Types: E-cigarettes  . Smokeless tobacco: Never Used  . Tobacco comment: mother smokes outside  Vaping Use  . Vaping Use: Some days  Substance Use Topics  . Alcohol use: No  . Drug use: No    Home Medications Prior to Admission medications   Medication Sig Start Date End Date Taking? Authorizing Provider  albuterol (VENTOLIN HFA) 108 (90 Base) MCG/ACT inhaler Inhale 2 puffs into the lungs every 6 (six) hours as needed for wheezing or shortness of breath. Patient not taking: No sig reported 03/15/19   Jannifer RodneyHawks, Christy A, FNP  alum & mag hydroxide-simeth (MAALOX/MYLANTA) 200-200-20 MG/5ML suspension Take 15 mLs by mouth every 4 (four) hours as needed for indigestion or heartburn. 05/06/20   Glade LloydAlekh, Kshitiz, MD  dicyclomine (BENTYL) 20 MG  tablet Take 1 tablet (20 mg total) by mouth 3 (three) times daily as needed for spasms. 05/06/20   Glade Lloyd, MD  ondansetron (ZOFRAN) 4 MG tablet Take 1 tablet (4 mg total) by mouth every 6 (six) hours as needed for nausea. 05/06/20   Glade Lloyd, MD  pantoprazole (PROTONIX) 40 MG tablet Take 1 tablet (40 mg total) by mouth daily. 05/06/20 06/05/20  Glade Lloyd, MD  traMADol (ULTRAM) 50 MG tablet Take 1 tablet (50 mg total) by mouth every 6 (six) hours as needed for moderate pain. 05/06/20   Glade Lloyd, MD    Allergies    Patient has no known allergies.  Review of Systems   Review of Systems  All other systems reviewed and are negative.   Physical Exam Updated Vital  Signs BP (!) 136/92 (BP Location: Left Arm)   Pulse 96   Temp 97.6 F (36.4 C) (Oral)   Resp 18   Ht 5\' 9"  (1.753 m)   Wt 82 kg   SpO2 100%   BMI 26.70 kg/m   Physical Exam Vitals reviewed.  Constitutional:      Appearance: He is ill-appearing.  HENT:     Head: Normocephalic and atraumatic.     Mouth/Throat:     Mouth: Mucous membranes are moist.     Pharynx: Oropharynx is clear. No pharyngeal swelling or oropharyngeal exudate.  Eyes:     General: Scleral icterus present.     Extraocular Movements: Extraocular movements intact.     Comments: Sunken in  Cardiovascular:     Rate and Rhythm: Normal rate and regular rhythm.     Heart sounds: Normal heart sounds. No murmur heard.   Pulmonary:     Effort: Pulmonary effort is normal. No respiratory distress.     Breath sounds: Normal breath sounds.  Abdominal:     General: Abdomen is scaphoid.     Palpations: There is no hepatomegaly or splenomegaly.     Tenderness: There is generalized abdominal tenderness. There is guarding.  Skin:    General: Skin is warm and dry.  Neurological:     General: No focal deficit present.     Mental Status: He is alert.  Psychiatric:        Mood and Affect: Mood normal.     ED Results / Procedures / Treatments   Labs (all labs ordered are listed, but only abnormal results are displayed) Labs Reviewed  LIPASE, BLOOD - Abnormal; Notable for the following components:      Result Value   Lipase 97 (*)    All other components within normal limits  COMPREHENSIVE METABOLIC PANEL - Abnormal; Notable for the following components:   Sodium 128 (*)    Potassium 3.0 (*)    Chloride 85 (*)    Glucose, Bld 158 (*)    BUN 57 (*)    Creatinine, Ser 1.74 (*)    Total Protein 8.9 (*)    AST 44 (*)    ALT 54 (*)    Total Bilirubin 2.5 (*)    GFR, Estimated 56 (*)    Anion gap 17 (*)    All other components within normal limits  CBC - Abnormal; Notable for the following components:   RBC 5.86  (*)    Hemoglobin 18.2 (*)    MCHC 36.8 (*)    RDW 11.2 (*)    All other components within normal limits  URINALYSIS, ROUTINE W REFLEX MICROSCOPIC  BILIRUBIN, FRACTIONATED(TOT/DIR/INDIR)  GAMMA GT  HEPATITIS PANEL, ACUTE    EKG EKG Interpretation  Date/Time:  Tuesday May 10 2020 11:33:30 EST Ventricular Rate:  99 PR Interval:  154 QRS Duration: 96 QT Interval:  432 QTC Calculation: 554 R Axis:   101 Text Interpretation:  Critical Test Result: Long QTc Normal sinus rhythm Right atrial enlargement Rightward axis Prolonged QT Abnormal ECG Confirmed by Jacalyn Lefevre [phone]) on 05/10/2020 11:39:22 AM   Radiology CT ABDOMEN PELVIS WO CONTRAST  Result Date: 05/10/2020 CLINICAL DATA:  Nausea and vomiting with abdominal pain EXAM: CT ABDOMEN AND PELVIS WITHOUT CONTRAST TECHNIQUE: Multidetector CT imaging of the abdomen and pelvis was performed following the standard protocol without IV contrast. COMPARISON:  05/05/2020 FINDINGS: Lower chest:  No contributory findings. Hepatobiliary: No focal liver abnormality.No evidence of biliary obstruction or stone. Pancreas: Unremarkable. Spleen: Unremarkable. Adrenals/Urinary Tract: Negative adrenals. No hydronephrosis or stone. Unremarkable bladder. Stomach/Bowel:  No obstruction. No appendicitis. Vascular/Lymphatic: No acute vascular abnormality. No mass or adenopathy. Reproductive:No pathologic findings. Other: No ascites or pneumoperitoneum. Musculoskeletal: No acute abnormalities. IMPRESSION: Negative abdominal CT. Electronically Signed   By: Marnee Spring M.D.   On: 05/10/2020 11:19    Procedures Procedures   Medications Ordered in ED Medications  potassium chloride 10 mEq in 100 mL IVPB (10 mEq Intravenous New Bag/Given 05/10/20 1125)  0.9 %  sodium chloride infusion (has no administration in time range)  sodium chloride 0.9 % bolus 1,000 mL (1,000 mLs Intravenous New Bag/Given 05/10/20 1043)  ondansetron (ZOFRAN-ODT) disintegrating  tablet 8 mg (8 mg Oral Given 05/10/20 1045)  morphine 2 MG/ML injection 2 mg (2 mg Intravenous Given 05/10/20 1049)    ED Course  I have reviewed the triage vital signs and the nursing notes.  Pertinent labs & imaging results that were available during my care of the patient were reviewed by me and considered in my medical decision making (see chart for details).    MDM Rules/Calculators/A&P                          Patient discharge 4 days ago from the hospital for abdominal pain and vomiting.  Since hospitalization his symptoms have not improved.  When he tried to eat solid foods again when he went home he continued to have vomiting.  Able to keep down liquids, but patient endorses urinating less often.  Endorses no bowel movement except for one time when he gave himself an enema 3 days ago.  Question whether lack of bowel movement due to constipation versus inability to keep solid foods down over the past several weeks.  Today his lab works indicates hypokalemia, AKI, elevated LFTs and bilirubin.  Repeating CT abdomen even though his previous CT at last admission was read as normal.  Unable to use contrast due to AKI.  Given his mildly elevated AST, ALT and a bilirubin of 2.5 there is concern for undiagnosed hepatic disease.  Getting fractionated bilirubin, GGT, and acute hepatitis panel.  Patient was given 2 mg IV morphine, ODT Zofran, fluid bolus and IV potassium.  Patient will likely need admission for intractable vomiting and AKI.    Update: Patient's CT abdomen pelvis once again looked benign.  Liver labs still pending.  EKG normal except for prolonged QTC of 554.  Would avoid further use of QTC prolonging medications.  Called and discussed admission for AKI and persistent vomiting with hospitalist team.  Final Clinical Impression(s) / ED Diagnoses Final diagnoses:  Generalized abdominal pain  Intractable  vomiting with nausea, unspecified vomiting type  AKI (acute kidney injury) [facility])     Rx / DC Orders ED Discharge Orders    None       Sandre Kitty, MD 05/10/20 1217    Jacalyn Lefevre, MD 05/10/20 1242

## 2020-05-10 NOTE — ED Triage Notes (Signed)
Patient reports persistent generalized abdominal pain with emesis onset last week , no fever or chills , seen here last week for the same complaints , patient added constipation .

## 2020-05-10 NOTE — H&P (Addendum)
History and Physical    HAWKEN BIELBY VPX:[id number] DOB: 16-May-1995 DOA: 05/10/2020  Referring MD/NP/PA: Otho Darner, MD(resident) PCP: Arnette Felts, FNP  Patient coming from:  Home   Chief Complaint: Nausea, vomiting, and abdominal pain  I have personally briefly reviewed patient's old medical records in [facility]   HPI: Tom Jones is a 25 y.o. male with medical history significant of marijuana abuse who presents with nausea, vomiting, and abdominal pain.  Patient was just recently hospitalized from 2/9-2/11 with nausea, vomiting, and abdominal pain after eating [employer] sandwich.  At that time he was found to have acute renal failure with creatinine elevated up to 3.2 and leukocytosis of 19,800.  Patient was treated with IV fluids and antiemetics with improvement in symptoms.  Since discharge he was trying to eat here and there and at first was able to keep some things down.  He continued to have generalized achy abdominal pain especially after eating.  Despite taking the medications prescribed at discharge which included antiemetics he began having nausea and vomiting.  He has been unable to keep any significant food or liquids down.  Emesis has been nonbloody in appearance associated symptoms of metallic taste in his mouth and malaise. He admits to smoking marijuana 2-3 days ago.  Denies any fever, chills, diarrhea, dysuria, or recent trauma. He had been taking tramadol which was prescribed tramadol at discharge, but had not normally been on any opiate pain medications previously.  He reports that he may drink alcohol once every other week or so, and does not drink it on a daily basis.  Patient questions if he has cancer, but does not note any significant family history for GI malignancies.  He does have significant family history of diabetes. He reported having similar symptoms like this previously in the past back in August 2020.  ED Course: Upon admission into the emergency department  patient was seen to be afebrile, pulse 71-1 09, and all other vital signs maintained labs significant for hemoglobin 18.2, sodium 128, chloride 85, CO2 26, BUN 57, creatinine 1.74(baseline  Cr 1), glucose 158, anion gap 17, lipase 97, AST 44, ALT 54, and total bilirubin 2.5.  Patient has been given 1 L normal saline IV fluids, morphine 2 mg IV, Zofran 8 mg p.o., and 10 mEq of potassium chloride IV.  TRH called to admit.  Review of Systems  Constitutional: Positive for malaise/fatigue. Negative for fever.  HENT: Negative for congestion and nosebleeds.   Eyes: Negative for photophobia and pain.  Respiratory: Negative for cough and shortness of breath.   Cardiovascular: Negative for chest pain and leg swelling.  Gastrointestinal: Positive for abdominal pain, nausea and vomiting. Negative for diarrhea.  Genitourinary: Negative for dysuria and hematuria.  Musculoskeletal: Negative for falls.  Neurological: Negative for focal weakness and loss of consciousness.  Psychiatric/Behavioral: Positive for substance abuse. Negative for memory loss.    Past Medical History:  Diagnosis Date  . Closed fibular fracture 05/07/2012   right distal fib. - sledding accident  . Seizures [facility]) age 86   febrile seizure x 1    Past Surgical History:  Procedure Laterality Date  . ORIF FIBULA FRACTURE Right 05/19/2012   Procedure: OPEN REDUCTION INTERNAL FIXATION (ORIF) FIBULA FRACTURE;  Surgeon: Nilda Simmer, MD;  Location: [facility];  Service: Orthopedics;  Laterality: Right;     reports that he has been smoking e-cigarettes. He has never used smokeless tobacco. He reports that he does not drink  alcohol and does not use drugs.  No Known Allergies  Family History  Problem Relation Age of Onset  . Diabetes Maternal Aunt        2 aunts  . Hypertension Maternal Aunt   . Heart disease Maternal Aunt        valve replacement  . Diabetes Maternal Grandmother     Prior to Admission medications    Medication Sig Start Date End Date Taking? Authorizing Provider  albuterol (VENTOLIN HFA) 108 (90 Base) MCG/ACT inhaler Inhale 2 puffs into the lungs every 6 (six) hours as needed for wheezing or shortness of breath. Patient not taking: No sig reported 03/15/19   Jannifer Rodney A, FNP  alum & mag hydroxide-simeth (MAALOX/MYLANTA) 200-200-20 MG/5ML suspension Take 15 mLs by mouth every 4 (four) hours as needed for indigestion or heartburn. 05/06/20   Glade Lloyd, MD  dicyclomine (BENTYL) 20 MG tablet Take 1 tablet (20 mg total) by mouth 3 (three) times daily as needed for spasms. 05/06/20   Glade Lloyd, MD  ondansetron (ZOFRAN) 4 MG tablet Take 1 tablet (4 mg total) by mouth every 6 (six) hours as needed for nausea. 05/06/20   Glade Lloyd, MD  pantoprazole (PROTONIX) 40 MG tablet Take 1 tablet (40 mg total) by mouth daily. 05/06/20 06/05/20  Glade Lloyd, MD  traMADol (ULTRAM) 50 MG tablet Take 1 tablet (50 mg total) by mouth every 6 (six) hours as needed for moderate pain. 05/06/20   Glade Lloyd, MD    Physical Exam:  Constitutional: Young male who appears to be acutely ill Vitals:   05/10/20 0621 05/10/20 0627 05/10/20 0825 05/10/20 1140  BP:  134/72 97/84 (!) 136/92  Pulse:  71 (!) 108 96  Resp:  20 16 18   Temp:  98.1 F (36.7 C)  97.6 F (36.4 C)  TempSrc:  Oral  Oral  SpO2:  100% 95% 100%  Weight: 82 kg     Height: 5\' 9"  (1.753 m)      Eyes: PERRL, lids and conjunctivae normal ENMT: Mucous membranes are dry. Posterior pharynx clear of any exudate or lesions.   Neck: normal, supple, no masses, no thyromegaly Respiratory: clear to auscultation bilaterally, no wheezing, no crackles. Normal respiratory effort. No accessory muscle use.  Cardiovascular: Regular rate and rhythm, no murmurs / rubs / gallops. No extremity edema. 2+ pedal pulses. No carotid bruits.  Abdomen: no tenderness, no masses palpated. No hepatosplenomegaly. Bowel sounds positive.  Musculoskeletal: no  clubbing / cyanosis. No joint deformity upper and lower extremities. Good ROM, no contractures. Normal muscle tone.  Skin: no rashes, lesions, ulcers. No induration Neurologic: CN 2-12 grossly intact. Sensation intact, DTR normal. Strength 5/5 in all 4.  Psychiatric: Normal judgment and insight. Alert and oriented x 3. Normal mood.     Labs on Admission: I have personally reviewed following labs and imaging studies  CBC: Recent Labs  Lab 05/04/20 2110 05/05/20 0633 05/06/20 0336 05/10/20 0623  WBC 19.8* 17.4* 10.1 7.9  NEUTROABS  --   --  7.4  --   HGB 18.8* 17.7* 15.2 18.2*  HCT 51.2 50.3 45.2 49.4  MCV 86.9 88.7 90.4 84.3  PLT 303 250 210 379   Basic Metabolic Panel: Recent Labs  Lab 05/04/20 2110 05/05/20 0633 05/06/20 0336 05/10/20 0623  NA 135 136 136 128*  K 3.4* 3.4* 3.9 3.0*  CL 97* 97* 101 85*  CO2 17* 23 25 26   GLUCOSE 119* 118* 102* 158*  BUN 33* 36* 20  57*  CREATININE 3.20* 1.67* 1.02 1.74*  CALCIUM 11.3* 10.3 9.4 9.6  MG  --  2.2 2.1  --    GFR: Estimated Creatinine Clearance: 66 mL/min (A) (by C-G formula based on SCr of 1.74 mg/dL (H)). Liver Function Tests: Recent Labs  Lab 05/04/20 2110 05/05/20 0633 05/10/20 0623  AST 25 22 44*  ALT 37 34 54*  ALKPHOS 118 111 108  BILITOT 1.8* 2.5* 2.5*  PROT 10.0* 9.1* 8.9*  ALBUMIN 5.8* 4.9 4.9   Recent Labs  Lab 05/04/20 2110 05/10/20 0623  LIPASE 21 97*   No results for input(s): AMMONIA in the last 168 hours. Coagulation Profile: No results for input(s): INR, PROTIME in the last 168 hours. Cardiac Enzymes: No results for input(s): CKTOTAL, CKMB, CKMBINDEX, TROPONINI in the last 168 hours. BNP (last 3 results) No results for input(s): PROBNP in the last 8760 hours. HbA1C: No results for input(s): HGBA1C in the last 72 hours. CBG: No results for input(s): GLUCAP in the last 168 hours. Lipid Profile: No results for input(s): CHOL, HDL, LDLCALC, TRIG, CHOLHDL, LDLDIRECT in the last 72  hours. Thyroid Function Tests: No results for input(s): TSH, T4TOTAL, FREET4, T3FREE, THYROIDAB in the last 72 hours. Anemia Panel: No results for input(s): VITAMINB12, FOLATE, FERRITIN, TIBC, IRON, RETICCTPCT in the last 72 hours. Urine analysis:    Component Value Date/Time   COLORURINE YELLOW 05/05/2020 0337   APPEARANCEUR HAZY (A) 05/05/2020 0337   LABSPEC >1.030 (H) 05/05/2020 0337   PHURINE 5.5 05/05/2020 0337   GLUCOSEU NEGATIVE 05/05/2020 0337   HGBUR NEGATIVE 05/05/2020 0337   BILIRUBINUR NEGATIVE 05/05/2020 0337   KETONESUR NEGATIVE 05/05/2020 0337   PROTEINUR 100 (A) 05/05/2020 0337   NITRITE NEGATIVE 05/05/2020 0337   LEUKOCYTESUR NEGATIVE 05/05/2020 0337   Sepsis Labs: Recent Results (from the past 240 hour(s))  Urine culture     Status: None   Collection Time: 05/05/20  1:28 AM   Specimen: Urine, Random  Result Value Ref Range Status   Specimen Description URINE, RANDOM  Final   Special Requests NONE  Final   Culture   Final    NO GROWTH Performed at [facility], [address], [place], [place] [address]    Report Status 05/06/2020 FINAL  Final  SARS CORONAVIRUS 2 (TAT 6-24 HRS) Nasopharyngeal Nasopharyngeal Swab     Status: None   Collection Time: 05/05/20  1:33 AM   Specimen: Nasopharyngeal Swab  Result Value Ref Range Status   SARS Coronavirus 2 NEGATIVE NEGATIVE Final    Comment: (NOTE) SARS-CoV-2 target nucleic acids are NOT DETECTED.  The SARS-CoV-2 RNA is generally detectable in upper and lower respiratory specimens during the acute phase of infection. Negative results do not preclude SARS-CoV-2 infection, do not rule out co-infections with other pathogens, and should not be used as the sole basis for treatment or other patient management decisions. Negative results must be combined with clinical observations, patient history, and epidemiological information. The expected result is Negative.  Fact Sheet for  Patients: HairSlick.no  Fact Sheet for Healthcare Providers: quierodirigir.com  This test is not yet approved or cleared by the [place] FDA and  has been authorized for detection and/or diagnosis of SARS-CoV-2 by FDA under an Emergency Use Authorization (EUA). This EUA will remain  in effect (meaning this test can be used) for the duration of the COVID-19 declaration under Se ction 564(b)(1) of the Act, 21 U.S.C. section 360bbb-3(b)(1), unless the authorization is terminated or revoked sooner.  Performed at [facility], [address], [place], [place] [address]      Radiological Exams on Admission: CT ABDOMEN PELVIS WO CONTRAST  Result Date: 05/10/2020 CLINICAL DATA:  Nausea and vomiting with abdominal pain EXAM: CT ABDOMEN AND PELVIS WITHOUT CONTRAST TECHNIQUE: Multidetector CT imaging of the abdomen and pelvis was performed following the standard protocol without IV contrast. COMPARISON:  05/05/2020 FINDINGS: Lower chest:  No contributory findings. Hepatobiliary: No focal liver abnormality.No evidence of biliary obstruction or stone. Pancreas: Unremarkable. Spleen: Unremarkable. Adrenals/Urinary Tract: Negative adrenals. No hydronephrosis or stone. Unremarkable bladder. Stomach/Bowel:  No obstruction. No appendicitis. Vascular/Lymphatic: No acute vascular abnormality. No mass or adenopathy. Reproductive:No pathologic findings. Other: No ascites or pneumoperitoneum. Musculoskeletal: No acute abnormalities. IMPRESSION: Negative abdominal CT. Electronically Signed   By: Marnee Spring M.D.   On: 05/10/2020 11:19    EKG: Independently reviewed.  Sinus rhythm at 99 bpm with QTC 554  Assessment/Plan Intractable nausea and vomiting abdominal pain: Acute on chronic.  Patient presents with persistent nausea, vomiting, and abdominal pain.  Lipase was elevated, but no acute CT evidence of pancreatitis or any other acute  abnormality.  Symptoms were initially thought to be brought on after eating a [employer] sandwich.  On the differential includes cannabinoid hyperemesis syndrome vs gastroenteritis versus possible gastroparesis versus other. -Admit to a medical telemetry bed -Aspiration precautions -Clear liquid diet and advance as tolerated -Place scopolamine patch  -Morphine IV as needed pain -GI formally consulted at family request for further evaluation  Acute kidney injury: Patient presents again with acute kidney injury creatinine elevated up to 1.74 with BUN elevated at 57.  Baseline creatinine previously noted to be around 1.  The elevated BUN to creatinine ratio suggests prerenal cause which goes along with nausea and vomiting.  CT scan of the abdomen pelvis did not show any signs of obstruction. -Follow-up urinalysis -Continue IV fluids  -Recheck kidney function in a.m.  Hypokalemia: Acute.  Initial potassium noted to be 3.  Patient had been given 10 mEq of potassium chloride IV. -0.45% normal saline IV fluids with 40 mEq of KCl at 125 mL/h   Prolonged QT interval: Acute.  On admission QTC 554 -Avoid QT prolonging medications -Correct electrolyte abnormalities -Recheck EKG in a.m.  Transaminitis and hyperbilirubinemia: Acute.  Initial labs revealed AST 44, ALT 54, and total bilirubin 2.5 with indirect bilirubin 2.1. -Follow-up acute hepatitis panel and GGT -Add-on acetaminophen level and salicylate level -Recheck CMP with direct bilirubin in a.m  Hemoconcentration: Acute. On admission hemoglobin elevated at 18.2, but previously had been within normal limits. Suspect falsely elevated due to dehydration -Continue to monitor.  Hyperglycemia: Acute. Glucose is mildly elevated at 158.  Suspect this is most likely reactive in nature.  He does report a significant family history of diabetes. -Check A1c in a.m.  Hyponatremia and hypochloremia: Acute.  Suspect secondary to intractable nausea and  vomiting. -Continue IV fluids -Recheck labs in a.m.  Elevated anion gap: Acute.  On admission anion gap elevated at 17, CO2 within normal limits at 26. -Continue to monitor  GERD: Patient reports having metallic taste in his mouth which could be coming from acid reflux.  At this time patient cannot tolerate being placed on Protonix due to prolonged QT interval.  Marijuana abuse: Patient admits to last using marijuana 2 to 3 days ago.  UDS obtained from 05/05/2020 positive for opiates and marijuana similar 10/2018.  He denies any other drug use.  Previously on the differential included  cannabinoid hyperemesis syndrome. -Counseled patient on the need to refrain from marijuana use  DVT prophylaxis: Lovenox Code Status: Full Family Communication: Patient's mother updated at bedside Disposition Plan: Hopefully discharge home in 1 to 2 days Consults called: None Admission status: Observation, require more than 2 midnight stay  Clydie Braunondell A Shruti Arrey MD [facility]   If 7PM-7AM, please contact night-coverage   05/10/2020, 11:57 AM

## 2020-05-10 NOTE — H&P (View-Only) (Signed)
Referring Provider:  [facility]         Primary Care Physician:  Minette Brine, FNP Primary Gastroenterologist: unassigned            We were asked to see this patient for:  Nausea , vomiting and abdominal pain                 ASSESSMENT / PLAN:   # 25 yo male readmitted with upper abdominal pain, nausea, vomiting, AKI, and electrolyte imbalances. (first admission was on 05/05/20). Two non-contrast CT scan in last week have been unrevealing. Had similar symptoms Aug 2020 and two CT scan w/ contrast were negative at that time. Etiology of symptoms unclear but nausea and vomiting could be secondary to frequent cannabis use,  --To exclude etiologies other than THC will schedule patient for EGD to be sometime tomorrow ( assuming electrolyte levels have improved). The risks and benefits of EGD were discussed and the patient agrees to proceed.  --If EGD negative consider checking cortisol level  # Prolonged QT. If EGD normal then can hopefully stop his PPI in setting of prolonged QT interval.   # Mild hyperbilirubinemia, predominantly indirect. Normal alk phos and only mildly elevated liver enzymes. No hepatobiliary findings on imaging.     ADDENDUM:  RN asked me about adding another anti-emetic. Patient is actively retching. I suspect that he is only on scopolamine due to prolonged QT.  I will contact [facility] about rechecking electrolytes to see if improved and is so then maybe add Zofran since he is being monitored on Telemetry    Attending Physician Note   I have taken a history, examined the patient and reviewed the chart. I agree with the Advanced Practitioner's note, impression and recommendations.  Recurrent N/V, upper abdominal pain with AKI and electrolyte abnormalities. CT AP unremarkable. Minimal AST, ALT elevation and mild indirect hyperbilirubinemia.  R/O GERD, ulcer, cannabis hyperemesis.   IV fluids, correct electrolytes, trend LFTs, EGD tomorrow at 1600.  Scopolamine is  not adequately controlling nausea, vomiting.  Zofran added with telemetry monitoring, given prolonged QTc.   Lucio Edward, MD FACG [phone]      HPI:                                                                                                                             Chief Complaint: nausea, vomiting, abdominal pain   Tom Jones is a 25 y.o. male with no significant medical history   Patient saw PCP and went to ED in Aug 2020 for nausea and vomiting of relatively new onset.. Symtoms felt to be secondary to [facility] use.  CT scan  X 2 around that time were negative for acute abnormalties. Labs including liver chemistries, lipase were normal at that time. He did have elevated WBC of 13.5. Patient says he gave himself an enema for cleansing and slowly got better. He was okay until several days ago   Patient  was in ED 05/05/20 with non-radiating upper abdominal pain, nausea and vomiting. The pain is achy, gets worse after eating. He had AKI, WBC of 19.8. Renal US negative.  Non-contrast CT scan negative. UDS was positive for THC and opiates. He confirmed use of THC but no illicit drug use.  Discharged home 2/11 after treatment for dehydration. Sent home with zofran, pantoprazole, bentyl, and tramadol.   Patient came back to ED this am with ongoing upper abdominal pain, nausea and vomiting. A hot shower helps symptoms. He has recurrent AKI. Lipase mildly elevated and mild elevation in liver enzymes. He has indirect hyperbilirubinemia. Na + 128, K+ 3. WBC 7.9, Hgb 18. Repeat non-contrast CT scan negative. Rad uses THC daily. He takes no herbs, supplements and denies illicit drug use. He doesn't take NSAIDS. He hasn't had any black stools or hematemesis. He hasn't been having regular BMs but attributes that to diminished PO intake. He reports a recent unintentional weight loss of 20 pounds.    EKG Interpretation    Date/Time:       Tuesday May 10 2020 11:33:30 EST        Text  Interpretation:       Normal sinus rhythm Right atrial enlargement Rightward axis Prolonged QT Abnormal ECG Reconfirmed by Isla Pence [phone]) on 05/10/2020 12:42:30 PM     Past Medical History:  Diagnosis Date  . Closed fibular fracture 05/07/2012   right distal fib. - sledding accident  . Seizures [facility]) age 5   febrile seizure x 1    Past Surgical History:  Procedure Laterality Date  . ORIF FIBULA FRACTURE Right 05/19/2012   Procedure: OPEN REDUCTION INTERNAL FIXATION (ORIF) FIBULA FRACTURE;  Surgeon: Lorn Junes, MD;  Location: [place];  Service: Orthopedics;  Laterality: Right;    Prior to Admission medications   Medication Sig Start Date End Date Taking? Authorizing Provider  alum & mag hydroxide-simeth (MAALOX/MYLANTA) 200-200-20 MG/5ML suspension Take 15 mLs by mouth every 4 (four) hours as needed for indigestion or heartburn. 05/06/20  Yes Aline August, MD  dicyclomine (BENTYL) 20 MG tablet Take 1 tablet (20 mg total) by mouth 3 (three) times daily as needed for spasms. 05/06/20  Yes Aline August, MD  ondansetron (ZOFRAN) 4 MG tablet Take 1 tablet (4 mg total) by mouth every 6 (six) hours as needed for nausea. 05/06/20  Yes Aline August, MD  pantoprazole (PROTONIX) 40 MG tablet Take 1 tablet (40 mg total) by mouth daily. 05/06/20 06/05/20 Yes Aline August, MD  traMADol (ULTRAM) 50 MG tablet Take 1 tablet (50 mg total) by mouth every 6 (six) hours as needed for moderate pain. 05/06/20  Yes Aline August, MD  albuterol (VENTOLIN HFA) 108 (90 Base) MCG/ACT inhaler Inhale 2 puffs into the lungs every 6 (six) hours as needed for wheezing or shortness of breath. Patient not taking: No sig reported 03/15/19   Sharion Balloon, FNP    Current Facility-Administered Medications  Medication Dose Route Frequency Provider Last Rate Last Admin  . 0.9 %  sodium chloride infusion   Intravenous Once Fuller Plan A, MD      . acetaminophen (TYLENOL) tablet 650 mg   650 mg Oral Q6H PRN Fuller Plan A, MD       Or  . acetaminophen (TYLENOL) suppository 650 mg  650 mg Rectal Q6H PRN Smith, Rondell A, MD      . albuterol (PROVENTIL) (2.5 MG/3ML) 0.083% nebulizer solution 2.5 mg  2.5 mg Nebulization Q6H  PRN Fuller Plan A, MD      . enoxaparin (LOVENOX) injection 40 mg  40 mg Subcutaneous Q24H Smith, Rondell A, MD      . metoprolol tartrate (LOPRESSOR) injection 5 mg  5 mg Intravenous Q6H PRN Smith, Rondell A, MD      . morphine 2 MG/ML injection 2 mg  2 mg Intravenous Q3H PRN Smith, Rondell A, MD      . scopolamine (TRANSDERM-SCOP) 1 MG/3DAYS 1.5 mg  1 patch Transdermal Q72H Smith, Rondell A, MD      . sodium chloride 0.45 % 1,000 mL with potassium chloride 40 mEq infusion   Intravenous Continuous Fuller Plan A, MD 125 mL/hr at 05/10/20 1335 New Bag at 05/10/20 1335  . sodium chloride flush (NS) 0.9 % injection 3 mL  3 mL Intravenous Q12H Norval Morton, MD        Allergies as of 05/10/2020  . (No Known Allergies)    Family History  Problem Relation Age of Onset  . Diabetes Maternal Aunt        2 aunts  . Hypertension Maternal Aunt   . Heart disease Maternal Aunt        valve replacement  . Diabetes Maternal Grandmother     Social History   Socioeconomic History  . Marital status: Single    Spouse name: Not on file  . Number of children: Not on file  . Years of education: Not on file  . Highest education level: Not on file  Occupational History  . Occupation: Ship broker  Tobacco Use  . Smoking status: Current Some Day Smoker    Types: E-cigarettes  . Smokeless tobacco: Never Used  . Tobacco comment: mother smokes outside  Vaping Use  . Vaping Use: Some days  Substance and Sexual Activity  . Alcohol use: No  . Drug use: No  . Sexual activity: Not on file  Other Topics Concern  . Not on file  Social History Narrative  . Not on file   Social Determinants of Health   Financial Resource Strain: Not on file  Food Insecurity:  Not on file  Transportation Needs: Not on file  Physical Activity: Not on file  Stress: Not on file  Social Connections: Not on file  Intimate Partner Violence: Not on file    Review of Systems: All systems reviewed and negative except where noted in HPI.  OBJECTIVE:    Physical Exam: Vital signs in last 24 hours: Temp:  [97.6 F (36.4 C)-98.6 F (37 C)] 98.6 F (37 C) (02/15 1400) Pulse Rate:  [71-109] 100 (02/15 1400) Resp:  [16-20] 16 (02/15 1400) BP: (97-142)/(72-94) 142/94 (02/15 1400) SpO2:  [93 %-100 %] 100 % (02/15 1400) Weight:  [82 kg] 82 kg (02/15 3382)   General:   Alert  male in NAD Psych:  Pleasant, cooperative. Normal mood and affect. Eyes:  Pupils equal, sclera clear, no icterus.   Conjunctiva pink. Ears:  Normal auditory acuity. Nose:  No deformity, discharge,  or lesions. Neck:  Supple; no masses Lungs:  Clear throughout to auscultation.   No wheezes, crackles, or rhonchi.  Heart:  Regular rate and rhythm; no murmurs, no lower extremity edema Abdomen:  Soft, non-distended, nontender, BS active, no palp mass   Rectal:  Deferred  Msk:  Symmetrical without gross deformities. . Neurologic:  Alert and  oriented x4;  grossly normal neurologically. Skin:  Intact without significant lesions or rashes.  Filed Weights   05/10/20 0621  Weight:  82 kg     Scheduled inpatient medications . enoxaparin (LOVENOX) injection  40 mg Subcutaneous Q24H  . scopolamine  1 patch Transdermal Q72H  . sodium chloride flush  3 mL Intravenous Q12H      Intake/Output from previous day: No intake/output data recorded. Intake/Output this shift: No intake/output data recorded.   Lab Results: Recent Labs    05/10/20 0623  WBC 7.9  HGB 18.2*  HCT 49.4  PLT 379   BMET Recent Labs    05/10/20 0623  NA 128*  K 3.0*  CL 85*  CO2 26  GLUCOSE 158*  BUN 57*  CREATININE 1.74*  CALCIUM 9.6   LFT Recent Labs    05/10/20 0623 05/10/20 1201  PROT 8.9*  --    ALBUMIN 4.9  --   AST 44*  --   ALT 54*  --   ALKPHOS 108  --   BILITOT 2.5* 2.3*  BILIDIR  --  0.2  IBILI  --  2.1*   PT/INR No results for input(s): LABPROT, INR in the last 72 hours. Hepatitis Panel Recent Labs    05/10/20 1201  HEPBSAG NON REACTIVE  HCVAB PENDING  HEPAIGM PENDING  HEPBIGM PENDING     . CBC Latest Ref Rng & Units 05/10/2020 05/06/2020 05/05/2020  WBC 4.0 - 10.5 K/uL 7.9 10.1 17.4(H)  Hemoglobin 13.0 - 17.0 g/dL 18.2(H) 15.2 17.7(H)  Hematocrit 39.0 - 52.0 % 49.4 45.2 50.3  Platelets 150 - 400 K/uL 379 210 250    . CMP Latest Ref Rng & Units 05/10/2020 05/10/2020 05/06/2020  Glucose 70 - 99 mg/dL - 158(H) 102(H)  BUN 6 - 20 mg/dL - 57(H) 20  Creatinine 0.61 - 1.24 mg/dL - 1.74(H) 1.02  Sodium 135 - 145 mmol/L - 128(L) 136  Potassium 3.5 - 5.1 mmol/L - 3.0(L) 3.9  Chloride 98 - 111 mmol/L - 85(L) 101  CO2 22 - 32 mmol/L - 26 25  Calcium 8.9 - 10.3 mg/dL - 9.6 9.4  Total Protein 6.5 - 8.1 g/dL - 8.9(H) -  Total Bilirubin 0.3 - 1.2 mg/dL 2.3(H) 2.5(H) -  Alkaline Phos 38 - 126 U/L - 108 -  AST 15 - 41 U/L - 44(H) -  ALT 0 - 44 U/L - 54(H) -   Studies/Results: CT ABDOMEN PELVIS WO CONTRAST  Result Date: 05/10/2020 CLINICAL DATA:  Nausea and vomiting with abdominal pain EXAM: CT ABDOMEN AND PELVIS WITHOUT CONTRAST TECHNIQUE: Multidetector CT imaging of the abdomen and pelvis was performed following the standard protocol without IV contrast. COMPARISON:  05/05/2020 FINDINGS: Lower chest:  No contributory findings. Hepatobiliary: No focal liver abnormality.No evidence of biliary obstruction or stone. Pancreas: Unremarkable. Spleen: Unremarkable. Adrenals/Urinary Tract: Negative adrenals. No hydronephrosis or stone. Unremarkable bladder. Stomach/Bowel:  No obstruction. No appendicitis. Vascular/Lymphatic: No acute vascular abnormality. No mass or adenopathy. Reproductive:No pathologic findings. Other: No ascites or pneumoperitoneum. Musculoskeletal: No acute  abnormalities. IMPRESSION: Negative abdominal CT. Electronically Signed   By: Monte Fantasia M.D.   On: 05/10/2020 11:19    Principal Problem:   Intractable nausea and vomiting Active Problems:   AKI (acute kidney injury) ([place])   Abdominal pain   Marijuana abuse   Hypokalemia   Prolonged QT interval   Hyponatremia   Hyperglycemia   Transaminitis   Hyperbilirubinemia    Tye Savoy, NP-C @  05/10/2020, 2:36 PM

## 2020-05-10 NOTE — ED Notes (Signed)
hospitalist at bedside

## 2020-05-11 ENCOUNTER — Encounter (HOSPITAL_COMMUNITY)
Admission: EM | Disposition: A | Discharge status: Home / Self Care | Payer: Self-pay | Source: Home / Self Care | Attending: Internal Medicine

## 2020-05-11 ENCOUNTER — Encounter (HOSPITAL_COMMUNITY): Payer: Self-pay | Admitting: Internal Medicine

## 2020-05-11 ENCOUNTER — Observation Stay (HOSPITAL_COMMUNITY): Payer: Self-pay | Admitting: Anesthesiology

## 2020-05-11 DIAGNOSIS — E43 Unspecified severe protein-calorie malnutrition: Secondary | ICD-10-CM | POA: Insufficient documentation

## 2020-05-11 DIAGNOSIS — R1010 Upper abdominal pain, unspecified: Secondary | ICD-10-CM

## 2020-05-11 DIAGNOSIS — R1084 Generalized abdominal pain: Secondary | ICD-10-CM

## 2020-05-11 HISTORY — PX: BIOPSY: SHX5522

## 2020-05-11 HISTORY — PX: ESOPHAGOGASTRODUODENOSCOPY (EGD) WITH PROPOFOL: SHX5813

## 2020-05-11 LAB — RAPID URINE DRUG SCREEN, HOSP PERFORMED
Amphetamines: NOT DETECTED
Barbiturates: NOT DETECTED
Benzodiazepines: NOT DETECTED
Cocaine: NOT DETECTED
Opiates: POSITIVE — AB
Tetrahydrocannabinol: POSITIVE — AB

## 2020-05-11 LAB — CBC
HCT: 41.9 % (ref 39.0–52.0)
Hemoglobin: 14.6 g/dL (ref 13.0–17.0)
MCH: 30.6 pg (ref 26.0–34.0)
MCHC: 34.8 g/dL (ref 30.0–36.0)
MCV: 87.8 fL (ref 80.0–100.0)
Platelets: 294 10*3/uL (ref 150–400)
RBC: 4.77 MIL/uL (ref 4.22–5.81)
RDW: 11.2 % — ABNORMAL LOW (ref 11.5–15.5)
WBC: 7.6 10*3/uL (ref 4.0–10.5)
nRBC: 0 % (ref 0.0–0.2)

## 2020-05-11 LAB — COMPREHENSIVE METABOLIC PANEL
ALT: 58 U/L — ABNORMAL HIGH (ref 0–44)
AST: 51 U/L — ABNORMAL HIGH (ref 15–41)
Albumin: 3.7 g/dL (ref 3.5–5.0)
Alkaline Phosphatase: 83 U/L (ref 38–126)
Anion gap: 11 (ref 5–15)
BUN: 24 mg/dL — ABNORMAL HIGH (ref 6–20)
CO2: 28 mmol/L (ref 22–32)
Calcium: 8.9 mg/dL (ref 8.9–10.3)
Chloride: 93 mmol/L — ABNORMAL LOW (ref 98–111)
Creatinine, Ser: 0.99 mg/dL (ref 0.61–1.24)
GFR, Estimated: >60 mL/min (ref >60)
Glucose, Bld: 94 mg/dL (ref 70–99)
Potassium: 3.4 mmol/L — ABNORMAL LOW (ref 3.5–5.1)
Sodium: 132 mmol/L — ABNORMAL LOW (ref 135–145)
Total Bilirubin: 3 mg/dL — ABNORMAL HIGH (ref 0.3–1.2)
Total Protein: 6.9 g/dL (ref 6.5–8.1)

## 2020-05-11 LAB — HEMOGLOBIN A1C
Hgb A1c MFr Bld: 4.8 % (ref 4.8–5.6)
Mean Plasma Glucose: 91 mg/dL

## 2020-05-11 LAB — CORTISOL: Cortisol, Plasma: 30.4 ug/dL

## 2020-05-11 LAB — MAGNESIUM: Magnesium: 2.6 mg/dL — ABNORMAL HIGH (ref 1.7–2.4)

## 2020-05-11 SURGERY — ESOPHAGOGASTRODUODENOSCOPY (EGD) WITH PROPOFOL
Anesthesia: Monitor Anesthesia Care

## 2020-05-11 MED ORDER — LACTATED RINGERS IV SOLN: INTRAVENOUS | Status: DC | PRN

## 2020-05-11 MED ORDER — PROCHLORPERAZINE EDISYLATE 10 MG/2ML IJ SOLN
10.0000 mg | Freq: Four times a day (QID) | INTRAMUSCULAR | Status: DC | PRN
  Administered 2020-05-11: 10 mg via INTRAVENOUS
  Filled 2020-05-11: qty 2

## 2020-05-11 MED ORDER — LIDOCAINE 2% (20 MG/ML) 5 ML SYRINGE
INTRAMUSCULAR | Status: DC | PRN
  Administered 2020-05-11: 100 mg via INTRAVENOUS

## 2020-05-11 MED ORDER — DEXAMETHASONE SODIUM PHOSPHATE 10 MG/ML IJ SOLN
INTRAMUSCULAR | Status: DC | PRN
  Administered 2020-05-11: 4 mg via INTRAVENOUS

## 2020-05-11 MED ORDER — SUCCINYLCHOLINE CHLORIDE 200 MG/10ML IV SOSY
PREFILLED_SYRINGE | INTRAVENOUS | Status: DC | PRN
  Administered 2020-05-11: 120 mg via INTRAVENOUS

## 2020-05-11 MED ORDER — ONDANSETRON HCL 4 MG/2ML IJ SOLN
INTRAMUSCULAR | Status: DC | PRN
  Administered 2020-05-11: 4 mg via INTRAVENOUS

## 2020-05-11 MED ORDER — PROPOFOL 10 MG/ML IV BOLUS
INTRAVENOUS | Status: DC | PRN
  Administered 2020-05-11: 200 mg via INTRAVENOUS

## 2020-05-11 MED ORDER — ONDANSETRON HCL 4 MG/2ML IJ SOLN
4.0000 mg | Freq: Four times a day (QID) | INTRAMUSCULAR | Status: DC | PRN
  Administered 2020-05-11: 4 mg via INTRAVENOUS
  Filled 2020-05-11: qty 2

## 2020-05-11 SURGICAL SUPPLY — 15 items

## 2020-05-11 NOTE — Op Note (Signed)
[facility] Patient Name: Tom Jones Procedure Date : 05/11/2020 MRN: [id number] Attending MD: Meryl Dare , MD Date of Birth: 1995-05-28 CSN: [id number] Age: 25 Admit Type: Inpatient Procedure:                Upper GI endoscopy Indications:              Upper abdominal pain, Nausea with vomiting Providers:                Venita Lick. Russella Dar, MD, [place] Bailiff, RN, Brion Aliment, Technician Referring MD:             [facility] Medicines:                Monitored Anesthesia Care Complications:            No immediate complications. Estimated Blood Loss:     Estimated blood loss was minimal. Procedure:                Pre-Anesthesia Assessment:                           - Prior to the procedure, a History and Physical                            was performed, and patient medications and                            allergies were reviewed. The patient's tolerance of                            previous anesthesia was also reviewed. The risks                            and benefits of the procedure and the sedation                            options and risks were discussed with the patient.                            All questions were answered, and informed consent                            was obtained. Prior Anticoagulants: The patient has                            taken no previous anticoagulant or antiplatelet                            agents. ASA Grade Assessment: II - A patient with                            mild systemic disease. After reviewing the risks  and benefits, the patient was deemed in                            satisfactory condition to undergo the procedure.                           After obtaining informed consent, the endoscope was                            passed under direct vision. Throughout the                            procedure, the patient's blood pressure, pulse, and                             oxygen saturations were monitored continuously. The                            GIF-H190 ([id number]) Olympus gastroscope was                            introduced through the mouth, and advanced to the                            second part of duodenum. The upper GI endoscopy was                            accomplished without difficulty. The patient                            tolerated the procedure well. Scope In: Scope Out: Findings:      The examined esophagus was normal. Biopsies were taken with a cold       forceps for histology.      The entire examined stomach was normal. Biopsies were taken with a cold       forceps for histology.      The duodenal bulb and second portion of the duodenum were normal. Impression:               - Normal esophagus. Biopsied.                           - Normal stomach. Biopsied.                           - Normal duodenal bulb and second portion of the                            duodenum. Recommendation:           - Return patient to hospital ward for ongoing care.                           - Resume previous diet.                           -  Continue present medications.                           - Discontinue marijuana use.                           - Schedule RUQ [place].                           - Await pathology results. Procedure Code(s):        --- Professional ---                           [phone], Esophagogastroduodenoscopy, flexible,                            transoral; with biopsy, single or multiple Diagnosis Code(s):        --- Professional ---                           R10.10, Upper abdominal pain, unspecified                           R11.2, Nausea with vomiting, unspecified CPT copyright 2019 American Medical Association. All rights reserved. The codes documented in this report are preliminary and upon coder review may  be revised to meet current compliance requirements. Meryl Dare, MD 05/11/2020 4:44:28 PM This report has been  signed electronically. Number of Addenda: 0

## 2020-05-11 NOTE — TOC Initial Note (Signed)
Transition of Care [facility]) - Initial/Assessment Note    Patient Details  Name: Tom Jones MRN: [id number] Date of Birth: 11-Sep-1995  [facility] CM/SW Contact:    Kingsley Plan, RN Phone Number: 05/11/2020, 10:54 AM  Clinical Narrative:                  Patient from home. Patient does not have insurance or PCP.   Discussed Transitions of Care Pharmacy and [facility] program at discharge. Patient agreeable.   Also discussed [facility] [facility] for PCP, pharmacy and financial counseling and orange card. Patient agreeable and appointment scheduled and placed on AVS.   Expected Discharge Plan: Home/Self Care Barriers to Discharge: Continued Medical Work up   Patient Goals and CMS Choice Patient states their goals for this hospitalization and ongoing recovery are:: to return to home CMS Medicare.gov Compare Post Acute Care list provided to:: Patient Choice offered to / list presented to : Patient  Expected Discharge Plan and Services Expected Discharge Plan: Home/Self Care In-house Referral: Financial Counselor Discharge Planning Services: CM Consult   Living arrangements for the past 2 months: Apartment                 DME Arranged: N/A         HH Arranged: NA          Prior Living Arrangements/Services Living arrangements for the past 2 months: Apartment Lives with:: Self Patient language and need for interpreter reviewed:: Yes Do you feel safe going back to the place where you live?: Yes      Need for Family Participation in Patient Care: No (Comment) Care giver support system in place?: Yes (comment)   Criminal Activity/Legal Involvement Pertinent to Current Situation/Hospitalization: No - Comment as needed  Activities of Daily Living Home Assistive Devices/Equipment: Eyeglasses ADL Screening (condition at time of admission) Patient's cognitive ability adequate to safely complete daily activities?: Yes Is the patient deaf or have  difficulty hearing?: No Does the patient have difficulty seeing, even when wearing glasses/contacts?: Yes Does the patient have difficulty concentrating, remembering, or making decisions?: No Patient able to express need for assistance with ADLs?: Yes Does the patient have difficulty dressing or bathing?: No Independently performs ADLs?: Yes (appropriate for developmental age) Does the patient have difficulty walking or climbing stairs?: No Weakness of Legs: None Weakness of Arms/Hands: None  Permission Sought/Granted   Permission granted to share information with : No              Emotional Assessment Appearance:: Appears stated age Attitude/Demeanor/Rapport: Engaged Affect (typically observed): Accepting Orientation: : Oriented to Self,Oriented to Place,Oriented to  Time,Oriented to Situation Alcohol / Substance Use: Not Applicable Psych Involvement: No (comment)  Admission diagnosis:  Generalized abdominal pain [R10.84] AKI (acute kidney injury) ([facility]) [N17.9] Intractable nausea and vomiting [R11.2] Intractable vomiting with nausea, unspecified vomiting type [R11.2] Patient Active Problem List   Diagnosis Date Noted  . Intractable nausea and vomiting 05/10/2020  . Marijuana abuse 05/10/2020  . Hypokalemia 05/10/2020  . Prolonged QT interval 05/10/2020  . Hyponatremia 05/10/2020  . Hyperglycemia 05/10/2020  . Transaminitis 05/10/2020  . Hyperbilirubinemia 05/10/2020  . AKI (acute kidney injury) ([facility]) 05/05/2020  . Abdominal pain 05/05/2020  . Hypercalcemia 05/05/2020  . Leukocytosis 05/05/2020  . History of marijuana use 11/04/2018  . Intractable vomiting 11/04/2018  . Closed fibular fracture   . Concussion without loss of consciousness 12/21/2010   PCP:  Arnette Felts, FNP Pharmacy:  [id number] [place], [place] - [address] [place] [address] Phone: [phone] Fax: [phone]  [facility] -  [place], [place] - [address] [place] [address] Phone: [phone] Fax: [phone]     Social Determinants of Health (SDOH) Interventions    Readmission Risk Interventions No flowsheet data found.

## 2020-05-11 NOTE — Anesthesia Procedure Notes (Signed)
Procedure Name: Intubation Date/Time: 05/11/2020 4:18 PM Performed by: Adria Dill, CRNA Pre-anesthesia Checklist: Patient identified, Emergency Drugs available, Suction available and Patient being monitored Patient Re-evaluated:Patient Re-evaluated prior to induction Oxygen Delivery Method: Circle system utilized Preoxygenation: Pre-oxygenation with 100% oxygen Induction Type: IV induction, Rapid sequence and Cricoid Pressure applied Laryngoscope Size: Miller and 3 Grade View: Grade I Tube type: Oral Tube size: 7.5 mm Number of attempts: 1 Airway Equipment and Method: Stylet and Oral airway Placement Confirmation: ETT inserted through vocal cords under direct vision,  positive ETCO2 and breath sounds checked- equal and bilateral Secured at: 22 cm Tube secured with: Tape Dental Injury: Teeth and Oropharynx as per pre-operative assessment

## 2020-05-11 NOTE — Progress Notes (Signed)
PROGRESS NOTE  Tom Jones GMW:[id number] DOB: 1995/11/13 DOA: 05/10/2020 PCP: Arnette Felts, FNP   LOS: 0 days   Brief Narrative / Interim history: 25 year old healthy gentleman with history of marijuana use comes into the hospital with nausea, vomiting abdominal pain.  He was recently hospitalized 2/9-2/11 with similar symptoms, he had a [employer] sandwich at that time and it may have been triggered by that.  He continues to have abdominal pain especially after eating and came back to the hospital.  Gastroenterology was consulted and he is scheduled to undergo an EGD later today  Subjective / 24h Interval events: Complains of persistent nausea, not really feeling good this morning  Assessment & Plan: Principal Problem Intractable nausea, vomiting, abdominal pain -Acute on chronic, appreciate gastroenterology and he will get an EGD this afternoon.  Has persistent symptoms this morning, continue n.p.o., symptomatic treatment  Active Problems Acute kidney injury -Due to poor p.o. intake, creatinine has normalized with fluids  Transaminitis and hyperbilirubinemia -Appreciate GI input, total bilirubin is 3.0 today, AST and ALT are mildly elevated in the 50s.  Acute hepatitis panel nonreactive.  A CT abdomen and pelvis was unremarkable  Hyponatremia and hypochloremia -Due to nausea, vomiting, continue IV fluids  Marijuana abuse -Patient admits to last using marijuana 2 to 3 days ago.  UDS obtained from 05/05/2020 positive for opiates and marijuana similar 10/2018.  He denies any other drug use.  Previously on the differential included cannabinoid hyperemesis syndrome. -Counseled regarding cessation  Scheduled Meds: . capsaicin   Topical BID  . enoxaparin (LOVENOX) injection  40 mg Subcutaneous Q24H  . scopolamine  1 patch Transdermal Q72H  . sodium chloride flush  3 mL Intravenous Q12H   Continuous Infusions: . sodium chloride 0.45 % with kcl 125 mL/hr at 05/11/20 1041   PRN  Meds:.acetaminophen **OR** acetaminophen, albuterol, metoprolol tartrate, morphine injection  Diet Orders (From admission, onward)    Start     Ordered   05/11/20 0001  Diet NPO time specified  Diet effective midnight        05/10/20 1539          DVT prophylaxis: enoxaparin (LOVENOX) injection 40 mg Start: 05/10/20 1230     Code Status: Full Code  Family Communication: no family at bedside   Status is: Observation  The patient will require care spanning > 2 midnights and should be moved to inpatient because: IV treatments appropriate due to intensity of illness or inability to take PO and Inpatient level of care appropriate due to severity of illness  Dispo: The patient is from: Home              Anticipated d/c is to: Home              Anticipated d/c date is: 2 days              Patient currently is not medically stable to d/c.   Difficult to place patient No  Level of care: Telemetry Medical  Consultants:  GI  Procedures:  none  Microbiology  none  Antimicrobials: none    Objective: Vitals:   05/10/20 1400 05/10/20 1709 05/10/20 2320 05/11/20 0513  BP: (!) 142/94 104/68 (!) 156/104 (!) 148/96  Pulse: 100 60 73 81  Resp: 16 17 16 16   Temp: 98.6 F (37 C) 98.4 F (36.9 C) 98.7 F (37.1 C) 98.7 F (37.1 C)  TempSrc: Oral Oral Oral Oral  SpO2: 100% 99% 99% 100%  Weight:  Height:        Intake/Output Summary (Last 24 hours) at 05/11/2020 1208 Last data filed at 05/10/2020 1836 Gross per 24 hour  Intake 867.08 ml  Output -  Net 867.08 ml   Filed Weights   05/10/20 0100  Weight: 82 kg    Examination:  Constitutional: NAD Eyes: no scleral icterus ENMT: Mucous membranes are moist.  Neck: normal, supple Respiratory: clear to auscultation bilaterally, no wheezing, no crackles. Normal respiratory effort. Cardiovascular: Regular rate and rhythm, no murmurs / rubs / gallops. No LE edema. Abdomen: non distended, no tenderness.  Musculoskeletal:  no clubbing / cyanosis.  Skin: no rashes Neurologic: CN 2-12 grossly intact. Strength 5/5 in all 4.   Data Reviewed: I have independently reviewed following labs and imaging studies   CBC: Recent Labs  Lab 05/04/20 2110 05/05/20 0633 05/06/20 0336 05/10/20 0623 05/11/20 0439  WBC 19.8* 17.4* 10.1 7.9 7.6  NEUTROABS  --   --  7.4  --   --   HGB 18.8* 17.7* 15.2 18.2* 14.6  HCT 51.2 50.3 45.2 49.4 41.9  MCV 86.9 88.7 90.4 84.3 87.8  PLT 303 250 210 379 294   Basic Metabolic Panel: Recent Labs  Lab 05/05/20 0633 05/06/20 0336 05/10/20 0623 05/10/20 1820 05/11/20 0439  NA 136 136 128* 131* 132*  K 3.4* 3.9 3.0* 3.3* 3.4*  CL 97* 101 85* 90* 93*  CO2 23 25 26 28 28   GLUCOSE 118* 102* 158* 113* 94  BUN 36* 20 57* 36* 24*  CREATININE 1.67* 1.02 1.74* 1.06 0.99  CALCIUM 10.3 9.4 9.6 9.1 8.9  MG 2.2 2.1  --   --  2.6*   Liver Function Tests: Recent Labs  Lab 05/04/20 2110 05/05/20 0633 05/10/20 0623 05/10/20 1201 05/11/20 0439  AST 25 22 44*  --  51*  ALT 37 34 54*  --  58*  ALKPHOS 118 111 108  --  83  BILITOT 1.8* 2.5* 2.5* 2.3* 3.0*  PROT 10.0* 9.1* 8.9*  --  6.9  ALBUMIN 5.8* 4.9 4.9  --  3.7   Coagulation Profile: No results for input(s): INR, PROTIME in the last 168 hours. HbA1C: Recent Labs    05/10/20 1359  HGBA1C 4.8   CBG: No results for input(s): GLUCAP in the last 168 hours.  Recent Results (from the past 240 hour(s))  Urine culture     Status: None   Collection Time: 05/05/20  1:28 AM   Specimen: Urine, Random  Result Value Ref Range Status   Specimen Description URINE, RANDOM  Final   Special Requests NONE  Final   Culture   Final    NO GROWTH Performed at [facility], [address] [place], [place]    Report Status 05/06/2020 FINAL  Final  SARS CORONAVIRUS 2 (TAT 6-24 HRS) Nasopharyngeal Nasopharyngeal Swab     Status: None   Collection Time: 05/05/20  1:33 AM   Specimen: Nasopharyngeal Swab  Result Value Ref  Range Status   SARS Coronavirus 2 NEGATIVE NEGATIVE Final    Comment: (NOTE) SARS-CoV-2 target nucleic acids are NOT DETECTED.  The SARS-CoV-2 RNA is generally detectable in upper and lower respiratory specimens during the acute phase of infection. Negative results do not preclude SARS-CoV-2 infection, do not rule out co-infections with other pathogens, and should not be used as the sole basis for treatment or other patient management decisions. Negative results must be combined with clinical observations, patient history, and epidemiological information.  The expected result is Negative.  Fact Sheet for Patients: HairSlick.no  Fact Sheet for Healthcare Providers: quierodirigir.com  This test is not yet approved or cleared by the [place] FDA and  has been authorized for detection and/or diagnosis of SARS-CoV-2 by FDA under an Emergency Use Authorization (EUA). This EUA will remain  in effect (meaning this test can be used) for the duration of the COVID-19 declaration under Se ction 564(b)(1) of the Act, 21 U.S.C. section 360bbb-3(b)(1), unless the authorization is terminated or revoked sooner.  Performed at [facility], [address] [place], [place] [address]   SARS CORONAVIRUS 2 (TAT 6-24 HRS) Nasopharyngeal Nasopharyngeal Swab     Status: None   Collection Time: 05/10/20 12:55 PM   Specimen: Nasopharyngeal Swab  Result Value Ref Range Status   SARS Coronavirus 2 NEGATIVE NEGATIVE Final    Comment: (NOTE) SARS-CoV-2 target nucleic acids are NOT DETECTED.  The SARS-CoV-2 RNA is generally detectable in upper and lower respiratory specimens during the acute phase of infection. Negative results do not preclude SARS-CoV-2 infection, do not rule out co-infections with other pathogens, and should not be used as the sole basis for treatment or other patient management decisions. Negative results must be combined  with clinical observations, patient history, and epidemiological information. The expected result is Negative.  Fact Sheet for Patients: HairSlick.no  Fact Sheet for Healthcare Providers: quierodirigir.com  This test is not yet approved or cleared by the [place] FDA and  has been authorized for detection and/or diagnosis of SARS-CoV-2 by FDA under an Emergency Use Authorization (EUA). This EUA will remain  in effect (meaning this test can be used) for the duration of the COVID-19 declaration under Se ction 564(b)(1) of the Act, 21 U.S.C. section 360bbb-3(b)(1), unless the authorization is terminated or revoked sooner.  Performed at [facility], [address], [place], [place] [address]      Radiology Studies: No results found.   Pamella Pert, MD, PhD [facility]  Between 7 am - 7 pm I am available, please contact me via Amion or Securechat  Between 7 pm - 7 am I am not available, please contact night coverage MD/APP via Amion

## 2020-05-11 NOTE — Progress Notes (Signed)
Initial Nutrition Assessment  DOCUMENTATION CODES:   Severe malnutrition in context of acute illness/injury  INTERVENTION:   - RD will monitor for diet advancement and add oral nutrition supplements as appropriate  NUTRITION DIAGNOSIS:   Severe Malnutrition related to acute illness (intractable nausea and vomiting) as evidenced by moderate fat depletion,moderate muscle depletion,energy intake < or equal to 50% for > or equal to 5 days.  GOAL:   Patient will meet greater than or equal to 90% of their needs  MONITOR:   Diet advancement,Labs,Weight trends  REASON FOR ASSESSMENT:   Malnutrition Screening Tool    ASSESSMENT:   25 year old male who presented to the ED on 2/15 with abdominal pain, N/V. Recent admission from 2/09 to 2/11 for AKI due to N/V. PMH of marijuana use.   Noted plan for EGD today. Pt currently NPO. Pt previously on a clear liquid diet with no meal completions charted.  Spoke with pt at bedside. RN at bedside providing nursing care. Pt continues to have N/V today despite current medications. Per RN, MD just ordered PRN IV Zofran.  Pt reports poor PO intake that began prior to his first admission on 2/09. Pt states that the last time he ate a full meal and tolerated it was about 1.5 weeks ago. Pt reports that after discharging on 2/11, he was only able to consume small amounts of solid foods but did vomit each time he ate. The only thing that he tolerated without vomiting was water. He reports that his grandmother made him "remedies" like prune juice and teas but that he vomited after consuming these.  Pt states that he typically has a great appetite and eats 3-4 meals daily. A meal typically consists of fast food (chicken sandwich and fries or Subway sandwich or nachos or hotdogs). Pt reports that he typically has daily BMs but has not had a BM in a while due to not eating much. He states that he had an enema at home which did not help much.  Pt reports a UBW  of 165-170 lbs. He reports a 20 lb weight loss since his symptoms first started. Current weight of 82 kg appears to have been carried over from a previous encounter. Recommend obtaining updated weight.  RD will monitor for diet advancement and order oral nutrition supplements as appropriate. Pt agreeable with plan.  Pt meets criteria for severe malnutrition in the context of acute illness.  Medications reviewed and include: scopolamine patch, 1/2NS with KCl @ 125 ml/hr  Labs reviewed: sodium 132, potassium 3.4, magnesium 2.6, elevated LFTs  NUTRITION - FOCUSED PHYSICAL EXAM:  Flowsheet Row Most Recent Value  Orbital Region Mild depletion  Upper Arm Region Moderate depletion  Thoracic and Lumbar Region Moderate depletion  Buccal Region Mild depletion  Temple Region Moderate depletion  Clavicle Bone Region Moderate depletion  Clavicle and Acromion Bone Region Moderate depletion  Scapular Bone Region Mild depletion  Dorsal Hand Mild depletion  Patellar Region Mild depletion  Anterior Thigh Region Moderate depletion  Posterior Calf Region Mild depletion  Edema (RD Assessment) None  Hair Reviewed  Eyes Reviewed  Mouth Reviewed  Skin Reviewed  Nails Reviewed       Diet Order:   Diet Order            Diet NPO time specified  Diet effective midnight                 EDUCATION NEEDS:   No education needs have been identified at this  time  Skin:  Skin Assessment: Reviewed RN Assessment  Last BM:  no documented BM  Height:   Ht Readings from Last 1 Encounters:  05/10/20 5\' 9"  (1.753 m)    Weight:   Wt Readings from Last 1 Encounters:  05/10/20 82 kg    BMI:  Body mass index is 26.7 kg/m.  Estimated Nutritional Needs:   Kcal:  2400-2600  Protein:  115-135 grams  Fluid:  >/= 2.2 L    05/12/20, MS, RD, LDN Inpatient Clinical Dietitian Please see AMiON for contact information.

## 2020-05-11 NOTE — Interval H&P Note (Signed)
History and Physical Interval Note:  05/11/2020 4:13 PM  Tom Jones  has presented today for surgery, with the diagnosis of nausea, vomiting, upper abdominal pain and weight loss.  The various methods of treatment have been discussed with the patient and family. After consideration of risks, benefits and other options for treatment, the patient has consented to  Procedure(s): ESOPHAGOGASTRODUODENOSCOPY (EGD) WITH PROPOFOL (N/A) as a surgical intervention.  The patient's history has been reviewed, patient examined, no change in status, stable for surgery.  I have reviewed the patient's chart and labs.  Questions were answered to the patient's satisfaction.     Venita Lick. Russella Dar

## 2020-05-11 NOTE — Transfer of Care (Signed)
Immediate Anesthesia Transfer of Care Note  Patient: Tom Jones  Procedure(s) Performed: ESOPHAGOGASTRODUODENOSCOPY (EGD) WITH PROPOFOL (N/A ) BIOPSY  Patient Location: Endoscopy Unit  Anesthesia Type:General  Level of Consciousness: drowsy  Airway & Oxygen Therapy: Patient Spontanous Breathing and Patient connected to face mask oxygen  Post-op Assessment: Report given to RN and Post -op Vital signs reviewed and stable  Post vital signs: Reviewed and stable  Last Vitals:  Vitals Value Taken Time  BP 169/102 05/11/20 1647  Temp    Pulse 113 05/11/20 1647  Resp 28 05/11/20 1647  SpO2 100 % 05/11/20 1647  Vitals shown include unvalidated device data.  Last Pain:  Vitals:   05/11/20 1515  TempSrc: Oral  PainSc: 0-No pain         Complications: No complications documented.

## 2020-05-11 NOTE — Progress Notes (Signed)
Patient request to take a shower.CCMD notified. IV wrapped

## 2020-05-11 NOTE — Progress Notes (Signed)
Pt taken to endo by endo staff. Pt GCS 15 appears calm, reports nausea. Pt mother at bedside

## 2020-05-11 NOTE — Anesthesia Preprocedure Evaluation (Addendum)
Anesthesia Evaluation  Patient identified by MRN, date of birth, ID band Patient awake    Reviewed: Allergy & Precautions, NPO status , Patient's Chart, lab work & pertinent test results  History of Anesthesia Complications Negative for: history of anesthetic complications  Airway Mallampati: II  TM Distance: >3 FB Neck ROM: Full    Dental  (+) Poor Dentition   Pulmonary Current Smoker,    Pulmonary exam normal        Cardiovascular negative cardio ROS Normal cardiovascular exam     Neuro/Psych negative neurological ROS     GI/Hepatic negative GI ROS, Neg liver ROS,   Endo/Other  negative endocrine ROS  Renal/GU negative Renal ROS     Musculoskeletal negative musculoskeletal ROS (+)   Abdominal   Peds  Hematology negative hematology ROS (+)   Anesthesia Other Findings   Reproductive/Obstetrics                            Anesthesia Physical Anesthesia Plan  ASA: II  Anesthesia Plan: MAC   Post-op Pain Management:    Induction: Intravenous, Rapid sequence and Cricoid pressure planned  PONV Risk Score and Plan: 1 and Ondansetron and Propofol infusion  Airway Management Planned: Oral ETT  Additional Equipment:   Intra-op Plan:   Post-operative Plan: Extubation in OR  Informed Consent: I have reviewed the patients History and Physical, chart, labs and discussed the procedure including the risks, benefits and alternatives for the proposed anesthesia with the patient or authorized representative who has indicated his/her understanding and acceptance.     Dental advisory given  Plan Discussed with: Anesthesiologist  Anesthesia Plan Comments:        Anesthesia Quick Evaluation

## 2020-05-12 ENCOUNTER — Inpatient Hospital Stay (HOSPITAL_COMMUNITY): Payer: Self-pay

## 2020-05-12 LAB — CBC
HCT: 41.7 % (ref 39.0–52.0)
Hemoglobin: 14.4 g/dL (ref 13.0–17.0)
MCH: 30.6 pg (ref 26.0–34.0)
MCHC: 34.5 g/dL (ref 30.0–36.0)
MCV: 88.7 fL (ref 80.0–100.0)
Platelets: 274 10*3/uL (ref 150–400)
RBC: 4.7 MIL/uL (ref 4.22–5.81)
RDW: 11 % — ABNORMAL LOW (ref 11.5–15.5)
WBC: 8.7 10*3/uL (ref 4.0–10.5)
nRBC: 0 % (ref 0.0–0.2)

## 2020-05-12 LAB — COMPREHENSIVE METABOLIC PANEL
ALT: 64 U/L — ABNORMAL HIGH (ref 0–44)
AST: 39 U/L (ref 15–41)
Albumin: 3.7 g/dL (ref 3.5–5.0)
Alkaline Phosphatase: 78 U/L (ref 38–126)
Anion gap: 9 (ref 5–15)
BUN: 21 mg/dL — ABNORMAL HIGH (ref 6–20)
CO2: 27 mmol/L (ref 22–32)
Calcium: 9.2 mg/dL (ref 8.9–10.3)
Chloride: 98 mmol/L (ref 98–111)
Creatinine, Ser: 0.98 mg/dL (ref 0.61–1.24)
GFR, Estimated: >60 mL/min (ref >60)
Glucose, Bld: 97 mg/dL (ref 70–99)
Potassium: 4.3 mmol/L (ref 3.5–5.1)
Sodium: 134 mmol/L — ABNORMAL LOW (ref 135–145)
Total Bilirubin: 2.8 mg/dL — ABNORMAL HIGH (ref 0.3–1.2)
Total Protein: 6.8 g/dL (ref 6.5–8.1)

## 2020-05-12 MED ORDER — PROCHLORPERAZINE MALEATE 10 MG PO TABS: 5.0000 mg | ORAL_TABLET | Freq: Four times a day (QID) | ORAL | 0 refills | Status: DC | PRN

## 2020-05-12 NOTE — Discharge Instructions (Signed)
Follow with Arnette Felts, FNP in 5-7 days  Please get a complete blood count and chemistry panel checked by your Primary MD at your next visit, and again as instructed by your Primary MD. Please get your medications reviewed and adjusted by your Primary MD.  Please request your Primary MD to go over all Hospital Tests and Procedure/Radiological results at the follow up, please get all Hospital records sent to your Prim MD by signing hospital release before you go home.  In some cases, there will be blood work, cultures and biopsy results pending at the time of your discharge. Please request that your primary care M.D. goes through all the records of your hospital data and follows up on these results.  If you had Pneumonia of Lung problems at the Hospital: Please get a 2 view Chest X ray done in 6-8 weeks after hospital discharge or sooner if instructed by your Primary MD.  If you have Congestive Heart Failure: Please call your Cardiologist or Primary MD anytime you have any of the following symptoms:  1) 3 pound weight gain in 24 hours or 5 pounds in 1 week  2) shortness of breath, with or without a dry hacking cough  3) swelling in the hands, feet or stomach  4) if you have to sleep on extra pillows at night in order to breathe  Follow cardiac low salt diet and 1.5 lit/day fluid restriction.  If you have diabetes Accuchecks 4 times/day, Once in AM empty stomach and then before each meal. Log in all results and show them to your primary doctor at your next visit. If any glucose reading is under 80 or above 300 call your primary MD immediately.  If you have Seizure/Convulsions/Epilepsy: Please do not drive, operate heavy machinery, participate in activities at heights or participate in high speed sports until you have seen by Primary MD or a Neurologist and advised to do so again. Per [facility] statutes, patients with seizures are not allowed to drive until they have been  seizure-free for six months.  Use caution when using heavy equipment or power tools. Avoid working on ladders or at heights. Take showers instead of baths. Ensure the water temperature is not too high on the home water heater. Do not go swimming alone. Do not lock yourself in a room alone (i.e. bathroom). When caring for infants or small children, sit down when holding, feeding, or changing them to minimize risk of injury to the child in the event you have a seizure. Maintain good sleep hygiene. Avoid alcohol.   If you had Gastrointestinal Bleeding: Please ask your Primary MD to check a complete blood count within one week of discharge or at your next visit. Your endoscopic/colonoscopic biopsies that are pending at the time of discharge, will also need to followed by your Primary MD.  Get Medicines reviewed and adjusted. Please take all your medications with you for your next visit with your Primary MD  Please request your Primary MD to go over all hospital tests and procedure/radiological results at the follow up, please ask your Primary MD to get all Hospital records sent to his/her office.  If you experience worsening of your admission symptoms, develop shortness of breath, life threatening emergency, suicidal or homicidal thoughts you must seek medical attention immediately by calling 911 or calling your MD immediately  if symptoms less severe.  You must read complete instructions/literature along with all the possible adverse reactions/side effects for all the Medicines you take  and that have been prescribed to you. Take any new Medicines after you have completely understood and accpet all the possible adverse reactions/side effects.   Do not drive or operate heavy machinery when taking Pain medications.   Do not take more than prescribed Pain, Sleep and Anxiety Medications  Special Instructions: If you have smoked or chewed Tobacco  in the last 2 yrs please stop smoking, stop any regular  Alcohol  and or any Recreational drug use.  Wear Seat belts while driving.  Please note You were cared for by a hospitalist during your hospital stay. If you have any questions about your discharge medications or the care you received while you were in the hospital after you are discharged, you can call the unit and asked to speak with the hospitalist on call if the hospitalist that took care of you is not available. Once you are discharged, your primary care physician will handle any further medical issues. Please note that NO REFILLS for any discharge medications will be authorized once you are discharged, as it is imperative that you return to your primary care physician (or establish a relationship with a primary care physician if you do not have one) for your aftercare needs so that they can reassess your need for medications and monitor your lab values.  You can reach the hospitalist office at phone [phone] or fax [phone]   If you do not have a primary care physician, you can call [phone] for a physician referral.  Activity: As tolerated with Full fall precautions use Mccarry/cane & assistance as needed    Diet: regular  Disposition Home

## 2020-05-12 NOTE — Progress Notes (Signed)
Patient discharged home with mother.

## 2020-05-12 NOTE — Progress Notes (Signed)
Discharge teaching complete. Meds, diet, activity, follow up appointments reviewed and all questions answered. Copy of instructions  and prescription given to patient.  

## 2020-05-12 NOTE — Anesthesia Postprocedure Evaluation (Signed)
Anesthesia Post Note  Patient: Tom Jones  Procedure(s) Performed: ESOPHAGOGASTRODUODENOSCOPY (EGD) WITH PROPOFOL (N/A ) BIOPSY     Patient location during evaluation: Endoscopy Anesthesia Type: General Level of consciousness: awake and alert Pain management: pain level controlled Vital Signs Assessment: post-procedure vital signs reviewed and stable Respiratory status: spontaneous breathing, nonlabored ventilation, respiratory function stable and patient connected to nasal cannula oxygen Cardiovascular status: blood pressure returned to baseline and stable Postop Assessment: no apparent nausea or vomiting Anesthetic complications: no   No complications documented.  Last Vitals:  Vitals:   05/11/20 2305 05/12/20 0503  BP: 126/85 121/82  Pulse: 72 76  Resp: 16 18  Temp: 37.1 C 37.1 C  SpO2: 97% 98%    Last Pain:  Vitals:   05/12/20 0800  TempSrc:   PainSc: 0-No pain                 Cecile Hearing

## 2020-05-12 NOTE — Discharge Summary (Signed)
Physician Discharge Summary  ETHAN CLAYBURN ZOX:[id number] DOB: 24-Sep-1995 DOA: 05/10/2020  PCP: Arnette Felts, FNP  Admit date: 05/10/2020 Discharge date: 05/12/2020  Admitted From: home Disposition:  home  Recommendations for Outpatient Follow-up:  1. Follow up with PCP in 1-2 weeks  Home Health: none Equipment/Devices: none  Discharge Condition: stable CODE STATUS: Full code Diet recommendation: regular  HPI: Per admitting MD, BURRIS MATHERNE is a 25 y.o. male with medical history significant of marijuana abuse who presents with nausea, vomiting, and abdominal pain.  Patient was just recently hospitalized from 2/9-2/11 with nausea, vomiting, and abdominal pain after eating [employer] sandwich.  At that time he was found to have acute renal failure with creatinine elevated up to 3.2 and leukocytosis of 19,800.  Patient was treated with IV fluids and antiemetics with improvement in symptoms.  Since discharge he was trying to eat here and there and at first was able to keep some things down.  He continued to have generalized achy abdominal pain especially after eating.  Despite taking the medications prescribed at discharge which included antiemetics he began having nausea and vomiting.  He has been unable to keep any significant food or liquids down.  Emesis has been nonbloody in appearance associated symptoms of metallic taste in his mouth and malaise. He admits to smoking marijuana 2-3 days ago.  Denies any fever, chills, diarrhea, dysuria, or recent trauma. He had been taking tramadol which was prescribed tramadol at discharge, but had not normally been on any opiate pain medications previously.  He reports that he may drink alcohol once every other week or so, and does not drink it on a daily basis.  Patient questions if he has cancer, but does not note any significant family history for GI malignancies.  He does have significant family history of diabetes. He reported having similar symptoms like  this previously in the past back in August 2020.  Hospital Course / Discharge diagnoses: Principal Problem Intractable nausea, vomiting, abdominal pain -patient has had similar episodes in the past without a clear cause.  Gastroenterology consulted and followed patient while hospitalized.  He underwent an EGD on 2/16 without significant findings.  With conservative management his symptoms have resolved, he is able to tolerate a regular diet and will be discharged home in stable condition.  He was advised to avoid marijuana  Active Problems Acute kidney injury -Due to poor p.o. intake, creatinine has normalized with fluids Transaminitis and hyperbilirubinemia -Appreciate GI input, he had mild LFT elevation, acute hepatitis panel was nonreactive.  CT abdomen and pelvis was unremarkable.  He underwent a right upper quadrant ultrasound which showed sludge but no cholecystitis or gallstones.  Given improvement in his symptoms he will be discharged home in stable condition with outpatient follow-up Hyponatremia and hypochloremia -resolved with fluids Marijuana abuse -Patient admits to last using marijuana 2 to 3 days ago. UDS obtained from 05/05/2020 positivefor opiates and marijuana similar 10/2018.He denies any other drug use. Previously on the differential included cannabinoid hyperemesis syndrome.  He said he will quit  Sepsis ruled out   Discharge Instructions   Allergies as of 05/12/2020   No Known Allergies     Medication List    TAKE these medications   albuterol 108 (90 Base) MCG/ACT inhaler Commonly known as: VENTOLIN HFA Inhale 2 puffs into the lungs every 6 (six) hours as needed for wheezing or shortness of breath.   alum & mag hydroxide-simeth 200-200-20 MG/5ML suspension Commonly known as: MAALOX/MYLANTA Take  15 mLs by mouth every 4 (four) hours as needed for indigestion or heartburn.   dicyclomine 20 MG tablet Commonly known as: BENTYL Take 1 tablet (20 mg total) by  mouth 3 (three) times daily as needed for spasms.   ondansetron 4 MG tablet Commonly known as: ZOFRAN Take 1 tablet (4 mg total) by mouth every 6 (six) hours as needed for nausea.   pantoprazole 40 MG tablet Commonly known as: Protonix Take 1 tablet (40 mg total) by mouth daily.   prochlorperazine 10 MG tablet Commonly known as: COMPAZINE Take 0.5-1 tablets (5-10 mg total) by mouth every 6 (six) hours as needed for nausea or vomiting.   traMADol 50 MG tablet Commonly known as: Ultram Take 1 tablet (50 mg total) by mouth every 6 (six) hours as needed for moderate pain.       Follow-up Information    [facility] Follow up.   Why: June 10, 2020 at 0930 Contact information: [address] [place] [id number] [phone]              Consultations:  GI  Procedures/Studies:  EGD 2/16  CT ABDOMEN PELVIS WO CONTRAST  Result Date: 05/10/2020 CLINICAL DATA:  Nausea and vomiting with abdominal pain EXAM: CT ABDOMEN AND PELVIS WITHOUT CONTRAST TECHNIQUE: Multidetector CT imaging of the abdomen and pelvis was performed following the standard protocol without IV contrast. COMPARISON:  05/05/2020 FINDINGS: Lower chest:  No contributory findings. Hepatobiliary: No focal liver abnormality.No evidence of biliary obstruction or stone. Pancreas: Unremarkable. Spleen: Unremarkable. Adrenals/Urinary Tract: Negative adrenals. No hydronephrosis or stone. Unremarkable bladder. Stomach/Bowel:  No obstruction. No appendicitis. Vascular/Lymphatic: No acute vascular abnormality. No mass or adenopathy. Reproductive:No pathologic findings. Other: No ascites or pneumoperitoneum. Musculoskeletal: No acute abnormalities. IMPRESSION: Negative abdominal CT. Electronically Signed   By: Marnee SpringJonathon  Watts M.D.   On: 05/10/2020 11:19   CT ABDOMEN PELVIS WO CONTRAST  Result Date: 05/05/2020 CLINICAL DATA:  Central abdominal pain for 1 day EXAM: CT ABDOMEN  AND PELVIS WITHOUT CONTRAST TECHNIQUE: Multidetector CT imaging of the abdomen and pelvis was performed following the standard protocol without IV contrast. COMPARISON:  November 12, 2018 FINDINGS: Lower chest: The visualized heart size within normal limits. No pericardial fluid/thickening. No hiatal hernia. The visualized portions of the lungs are clear. Hepatobiliary: The liver is normal in density without focal abnormality.The main portal vein is patent. No evidence of calcified gallstones, gallbladder wall thickening or biliary dilatation. Pancreas: Unremarkable. No pancreatic ductal dilatation or surrounding inflammatory changes. Spleen: Normal in size without focal abnormality. Adrenals/Urinary Tract: Both adrenal glands appear normal. The kidneys and collecting system appear normal without evidence of urinary tract calculus or hydronephrosis. Bladder is unremarkable. Stomach/Bowel: The stomach, small bowel, and colon are normal in appearance. No inflammatory changes, wall thickening, or obstructive findings. There is a moderate amount of right colonic stool present.The appendix is normal. Vascular/Lymphatic: There are no enlarged mesenteric, retroperitoneal, or pelvic lymph nodes. No significant vascular findings are present. Reproductive: The prostate is unremarkable. Other: No evidence of abdominal wall mass or hernia. Musculoskeletal: No acute or significant osseous findings. IMPRESSION: No acute intra-abdominal or pelvic pathology to explain the patient's symptoms. Moderate amount right colonic stool. Electronically Signed   By: Jonna ClarkBindu  Avutu M.D.   On: 05/05/2020 00:21   US Renal  Result Date: 05/05/2020 CLINICAL DATA:  Acute renal failure EXAM: RENAL / URINARY TRACT ULTRASOUND COMPLETE COMPARISON:  None. FINDINGS: Right Kidney: Renal measurements: 9.6 x 4.9  x 5.3 cm = volume: 129 mL. Echogenicity within normal limits. No mass or hydronephrosis visualized. Left Kidney: Renal measurements: 10.7 x 6.1 x  4.2 cm = volume: 145.1 mL. Echogenicity within normal limits. No mass or hydronephrosis visualized. Bladder: Foley catheter in the bladder. Other: None. IMPRESSION: Normal renal ultrasound. Electronically Signed   By: Deatra Robinson M.D.   On: 05/05/2020 03:07   US Abdomen Limited RUQ (LIVER/GB)  Result Date: 05/12/2020 CLINICAL DATA:  Upper abdominal pain for 8 days, elevated LFTs EXAM: ULTRASOUND ABDOMEN LIMITED RIGHT UPPER QUADRANT COMPARISON:  None; correlation CT abdomen and pelvis 11/11/2018 FINDINGS: Gallbladder: Gallbladder filled with sludge. No shadowing calculi, gallbladder wall thickening, or pericholecystic fluid. No sonographic Murphy sign. Common bile duct: Diameter: 3 mm, normal Liver: Normal parenchymal echogenicity. Hyperechoic focus at anterior liver 3.0 x 1.3 x 2.2 cm, adjacent to falciform fissure; this could represent a small hepatic hemangioma or focal fatty infiltration. This was present on the prior CT exam as well but now appears slightly larger. No additional masses or intrahepatic biliary dilatation. Portal vein is patent on color Doppler imaging with normal direction of blood flow towards the liver. Other: No RIGHT upper quadrant free fluid. IMPRESSION: Gallbladder filled with sludge but no shadowing calculi are identified. Hyperechoic focus anterior LEFT lobe liver adjacent to falciform fissure 3.0 cm greatest size, question focal fatty infiltration versus small hepatic hemangioma, increased in size since 2020 Electronically Signed   By: Ulyses Southward M.D.   On: 05/12/2020 08:53      Subjective: - no chest pain, shortness of breath, no abdominal pain, nausea or vomiting.   Discharge Exam: BP 121/82 (BP Location: Left Arm)   Pulse 76   Temp 98.7 F (37.1 C) (Oral)   Resp 18   Ht  (1.753 m)   Wt 82 kg   SpO2 98%   BMI 26.70 kg/m   General: Pt is alert, awake, not in acute distress Cardiovascular: RRR, S1/S2 +, no rubs, no gallops Respiratory: CTA bilaterally,  no wheezing, no rhonchi Abdominal: Soft, NT, ND, bowel sounds + Extremities: no edema, no cyanosis    The results of significant diagnostics from this hospitalization (including imaging, microbiology, ancillary and laboratory) are listed below for reference.     Microbiology: Recent Results (from the past 240 hour(s))  Urine culture     Status: None   Collection Time: 05/05/20  1:28 AM   Specimen: Urine, Random  Result Value Ref Range Status   Specimen Description URINE, RANDOM  Final   Special Requests NONE  Final   Culture   Final    NO GROWTH Performed at [facility], [address] [place], [place] [address]    Report Status 05/06/2020 FINAL  Final  SARS CORONAVIRUS 2 (TAT 6-24 HRS) Nasopharyngeal Nasopharyngeal Swab     Status: None   Collection Time: 05/05/20  1:33 AM   Specimen: Nasopharyngeal Swab  Result Value Ref Range Status   SARS Coronavirus 2 NEGATIVE NEGATIVE Final    Comment: (NOTE) SARS-CoV-2 target nucleic acids are NOT DETECTED.  The SARS-CoV-2 RNA is generally detectable in upper and lower respiratory specimens during the acute phase of infection. Negative results do not preclude SARS-CoV-2 infection, do not rule out co-infections with other pathogens, and should not be used as the sole basis for treatment or other patient management decisions. Negative results must be combined with clinical observations, patient history, and epidemiological information. The expected result is Negative.  Fact Sheet for  Patients: HairSlick.no  Fact Sheet for Healthcare Providers: quierodirigir.com  This test is not yet approved or cleared by the [place] FDA and  has been authorized for detection and/or diagnosis of SARS-CoV-2 by FDA under an Emergency Use Authorization (EUA). This EUA will remain  in effect (meaning this test can be used) for the duration of the COVID-19 declaration under Se ction  564(b)(1) of the Act, 21 U.S.C. section 360bbb-3(b)(1), unless the authorization is terminated or revoked sooner.  Performed at [facility], [address], [place], [place] [address]   SARS CORONAVIRUS 2 (TAT 6-24 HRS) Nasopharyngeal Nasopharyngeal Swab     Status: None   Collection Time: 05/10/20 12:55 PM   Specimen: Nasopharyngeal Swab  Result Value Ref Range Status   SARS Coronavirus 2 NEGATIVE NEGATIVE Final    Comment: (NOTE) SARS-CoV-2 target nucleic acids are NOT DETECTED.  The SARS-CoV-2 RNA is generally detectable in upper and lower respiratory specimens during the acute phase of infection. Negative results do not preclude SARS-CoV-2 infection, do not rule out co-infections with other pathogens, and should not be used as the sole basis for treatment or other patient management decisions. Negative results must be combined with clinical observations, patient history, and epidemiological information. The expected result is Negative.  Fact Sheet for Patients: HairSlick.no  Fact Sheet for Healthcare Providers: quierodirigir.com  This test is not yet approved or cleared by the [place] FDA and  has been authorized for detection and/or diagnosis of SARS-CoV-2 by FDA under an Emergency Use Authorization (EUA). This EUA will remain  in effect (meaning this test can be used) for the duration of the COVID-19 declaration under Se ction 564(b)(1) of the Act, 21 U.S.C. section 360bbb-3(b)(1), unless the authorization is terminated or revoked sooner.  Performed at [facility], [address] [place], [place] [address]      Labs: Basic Metabolic Panel: Recent Labs  Lab 05/06/20 0336 05/10/20 0623 05/10/20 1820 05/11/20 0439 05/12/20 0314  NA 136 128* 131* 132* 134*  K 3.9 3.0* 3.3* 3.4* 4.3  CL 101 85* 90* 93* 98  CO2 25 26 28 28 27   GLUCOSE 102* 158* 113* 94 97  BUN 20 57* 36* 24* 21*   CREATININE 1.02 1.74* 1.06 0.99 0.98  CALCIUM 9.4 9.6 9.1 8.9 9.2  MG 2.1  --   --  2.6*  --    Liver Function Tests: Recent Labs  Lab 05/10/20 0623 05/10/20 1201 05/11/20 0439 05/12/20 0314  AST 44*  --  51* 39  ALT 54*  --  58* 64*  ALKPHOS 108  --  83 78  BILITOT 2.5* 2.3* 3.0* 2.8*  PROT 8.9*  --  6.9 6.8  ALBUMIN 4.9  --  3.7 3.7   CBC: Recent Labs  Lab 05/06/20 0336 05/10/20 0623 05/11/20 0439 05/12/20 0314  WBC 10.1 7.9 7.6 8.7  NEUTROABS 7.4  --   --   --   HGB 15.2 18.2* 14.6 14.4  HCT 45.2 49.4 41.9 41.7  MCV 90.4 84.3 87.8 88.7  PLT 210 379 294 274   CBG: No results for input(s): GLUCAP in the last 168 hours. Hgb A1c Recent Labs    05/10/20 1359  HGBA1C 4.8   Lipid Profile No results for input(s): CHOL, HDL, LDLCALC, TRIG, CHOLHDL, LDLDIRECT in the last 72 hours. Thyroid function studies No results for input(s): TSH, T4TOTAL, T3FREE, THYROIDAB in the last 72 hours.  Invalid input(s): FREET3 Urinalysis    Component  Value Date/Time   COLORURINE YELLOW 05/10/2020 1249   APPEARANCEUR CLEAR 05/10/2020 1249   LABSPEC 1.025 05/10/2020 1249   PHURINE 6.0 05/10/2020 1249   GLUCOSEU NEGATIVE 05/10/2020 1249   HGBUR NEGATIVE 05/10/2020 1249   BILIRUBINUR NEGATIVE 05/10/2020 1249   KETONESUR 15 (A) 05/10/2020 1249   PROTEINUR 30 (A) 05/10/2020 1249   NITRITE NEGATIVE 05/10/2020 1249   LEUKOCYTESUR NEGATIVE 05/10/2020 1249    FURTHER DISCHARGE INSTRUCTIONS:   Get Medicines reviewed and adjusted: Please take all your medications with you for your next visit with your Primary MD   Laboratory/radiological data: Please request your Primary MD to go over all hospital tests and procedure/radiological results at the follow up, please ask your Primary MD to get all Hospital records sent to his/her office.   In some cases, they will be blood work, cultures and biopsy results pending at the time of your discharge. Please request that your primary care M.D.  goes through all the records of your hospital data and follows up on these results.   Also Note the following: If you experience worsening of your admission symptoms, develop shortness of breath, life threatening emergency, suicidal or homicidal thoughts you must seek medical attention immediately by calling 911 or calling your MD immediately  if symptoms less severe.   You must read complete instructions/literature along with all the possible adverse reactions/side effects for all the Medicines you take and that have been prescribed to you. Take any new Medicines after you have completely understood and accpet all the possible adverse reactions/side effects.    Do not drive when taking Pain medications or sleeping medications (Benzodaizepines)   Do not take more than prescribed Pain, Sleep and Anxiety Medications. It is not advisable to combine anxiety,sleep and pain medications without talking with your primary care practitioner   Special Instructions: If you have smoked or chewed Tobacco  in the last 2 yrs please stop smoking, stop any regular Alcohol  and or any Recreational drug use.   Wear Seat belts while driving.   Please note: You were cared for by a hospitalist during your hospital stay. Once you are discharged, your primary care physician will handle any further medical issues. Please note that NO REFILLS for any discharge medications will be authorized once you are discharged, as it is imperative that you return to your primary care physician (or establish a relationship with a primary care physician if you do not have one) for your post hospital discharge needs so that they can reassess your need for medications and monitor your lab values.  Time coordinating discharge: 40 minutes  SIGNED:  Pamella Pert, MD, PhD 05/12/2020, 3:11 PM

## 2020-05-12 NOTE — Progress Notes (Signed)
Patient has been NPO past MN.  Last meal 05/11/20 2200

## 2020-05-13 ENCOUNTER — Encounter: Payer: Self-pay | Admitting: Gastroenterology

## 2020-05-13 LAB — SURGICAL PATHOLOGY

## 2020-05-15 ENCOUNTER — Encounter (HOSPITAL_COMMUNITY): Payer: Self-pay | Admitting: Gastroenterology

## 2020-05-17 ENCOUNTER — Telehealth: Payer: Self-pay

## 2020-05-17 NOTE — Telephone Encounter (Signed)
I called patient to do a TCM call and schedule him for a hospital f/u. Pt did not answer nor was I able to leave a vm YL,RMA

## 2020-05-18 NOTE — Telephone Encounter (Signed)
Noted  

## 2020-06-10 ENCOUNTER — Ambulatory Visit: Payer: Self-pay | Attending: Nurse Practitioner | Admitting: Nurse Practitioner

## 2020-06-10 ENCOUNTER — Other Ambulatory Visit: Payer: Self-pay

## 2021-12-19 ENCOUNTER — Emergency Department (HOSPITAL_COMMUNITY): Payer: Self-pay

## 2021-12-19 ENCOUNTER — Encounter (HOSPITAL_COMMUNITY): Payer: Self-pay | Admitting: Emergency Medicine

## 2021-12-19 ENCOUNTER — Other Ambulatory Visit: Payer: Self-pay

## 2021-12-19 ENCOUNTER — Emergency Department (HOSPITAL_COMMUNITY)
Admission: EM | Admit: 2021-12-19 | Discharge: 2021-12-19 | Disposition: A | Discharge status: Home / Self Care | Payer: Self-pay | Attending: Student | Admitting: Student

## 2021-12-19 DIAGNOSIS — Z20822 Contact with and (suspected) exposure to covid-19: Secondary | ICD-10-CM | POA: Insufficient documentation

## 2021-12-19 DIAGNOSIS — F17210 Nicotine dependence, cigarettes, uncomplicated: Secondary | ICD-10-CM | POA: Insufficient documentation

## 2021-12-19 DIAGNOSIS — R1013 Epigastric pain: Secondary | ICD-10-CM | POA: Insufficient documentation

## 2021-12-19 DIAGNOSIS — G8929 Other chronic pain: Secondary | ICD-10-CM | POA: Insufficient documentation

## 2021-12-19 DIAGNOSIS — D72829 Elevated white blood cell count, unspecified: Secondary | ICD-10-CM | POA: Insufficient documentation

## 2021-12-19 DIAGNOSIS — R112 Nausea with vomiting, unspecified: Secondary | ICD-10-CM | POA: Insufficient documentation

## 2021-12-19 LAB — CBC WITH DIFFERENTIAL/PLATELET
Abs Immature Granulocytes: 0.08 10*3/uL — ABNORMAL HIGH (ref 0.00–0.07)
Basophils Absolute: 0.1 10*3/uL (ref 0.0–0.1)
Basophils Relative: 0 %
Eosinophils Absolute: 0 10*3/uL (ref 0.0–0.5)
Eosinophils Relative: 0 %
HCT: 49.6 % (ref 39.0–52.0)
Hemoglobin: 17.2 g/dL — ABNORMAL HIGH (ref 13.0–17.0)
Immature Granulocytes: 1 %
Lymphocytes Relative: 6 %
Lymphs Abs: 1.1 10*3/uL (ref 0.7–4.0)
MCH: 30.9 pg (ref 26.0–34.0)
MCHC: 34.7 g/dL (ref 30.0–36.0)
MCV: 89.2 fL (ref 80.0–100.0)
Monocytes Absolute: 0.8 10*3/uL (ref 0.1–1.0)
Monocytes Relative: 4 %
Neutro Abs: 15.6 10*3/uL — ABNORMAL HIGH (ref 1.7–7.7)
Neutrophils Relative %: 89 %
Platelets: 277 10*3/uL (ref 150–400)
RBC: 5.56 MIL/uL (ref 4.22–5.81)
RDW: 11.9 % (ref 11.5–15.5)
WBC: 17.5 10*3/uL — ABNORMAL HIGH (ref 4.0–10.5)
nRBC: 0 % (ref 0.0–0.2)

## 2021-12-19 LAB — COMPREHENSIVE METABOLIC PANEL
ALT: 24 U/L (ref 0–44)
AST: 26 U/L (ref 15–41)
Albumin: 5.2 g/dL — ABNORMAL HIGH (ref 3.5–5.0)
Alkaline Phosphatase: 134 U/L — ABNORMAL HIGH (ref 38–126)
Anion gap: 16 — ABNORMAL HIGH (ref 5–15)
BUN: 12 mg/dL (ref 6–20)
CO2: 22 mmol/L (ref 22–32)
Calcium: 10.4 mg/dL — ABNORMAL HIGH (ref 8.9–10.3)
Chloride: 102 mmol/L (ref 98–111)
Creatinine, Ser: 1.07 mg/dL (ref 0.61–1.24)
GFR, Estimated: >60 mL/min (ref >60)
Glucose, Bld: 160 mg/dL — ABNORMAL HIGH (ref 70–99)
Potassium: 3.7 mmol/L (ref 3.5–5.1)
Sodium: 140 mmol/L (ref 135–145)
Total Bilirubin: 2.3 mg/dL — ABNORMAL HIGH (ref 0.3–1.2)
Total Protein: 9 g/dL — ABNORMAL HIGH (ref 6.5–8.1)

## 2021-12-19 LAB — RESP PANEL BY RT-PCR (FLU A&B, COVID) ARPGX2
Influenza A by PCR: NEGATIVE
Influenza B by PCR: NEGATIVE
SARS Coronavirus 2 by RT PCR: NEGATIVE

## 2021-12-19 LAB — LIPASE, BLOOD: Lipase: 28 U/L (ref 11–51)

## 2021-12-19 LAB — LACTIC ACID, PLASMA: Lactic Acid, Venous: 2 mmol/L (ref 0.5–1.9)

## 2021-12-19 MED ORDER — IOHEXOL 350 MG/ML SOLN
75.0000 mL | Freq: Once | INTRAVENOUS | Status: AC | PRN
  Administered 2021-12-19: 75 mL via INTRAVENOUS

## 2021-12-19 MED ORDER — LACTATED RINGERS IV BOLUS
1000.0000 mL | Freq: Once | INTRAVENOUS | Status: AC
  Administered 2021-12-19: 1000 mL via INTRAVENOUS

## 2021-12-19 MED ORDER — LORAZEPAM 2 MG/ML IJ SOLN
0.5000 mg | Freq: Once | INTRAMUSCULAR | Status: AC
  Administered 2021-12-19: 0.5 mg via INTRAVENOUS
  Filled 2021-12-19: qty 1

## 2021-12-19 MED ORDER — ONDANSETRON 4 MG PO TBDP
4.0000 mg | ORAL_TABLET | Freq: Once | ORAL | Status: AC
  Administered 2021-12-19: 4 mg via ORAL
  Filled 2021-12-19: qty 1

## 2021-12-19 MED ORDER — DROPERIDOL 2.5 MG/ML IJ SOLN
1.2500 mg | Freq: Once | INTRAMUSCULAR | Status: AC
  Administered 2021-12-19: 1.25 mg via INTRAVENOUS
  Filled 2021-12-19: qty 2

## 2021-12-19 NOTE — ED Provider Notes (Signed)
[facility] EMERGENCY DEPARTMENT Provider Note  CSN: [id number] Arrival date & time: 12/19/21 5883  Chief Complaint(s) Emesis  HPI Tom Jones is a 26 y.o. male with PMH of chronic abdominal pain with nausea vomiting, marijuana use who presents emergency department for evaluation of abdominal pain nausea and vomiting.  He states that he used to tomato sauce yesterday at dinner that he thinks triggered his gallbladder and he has had right upper quadrant pain, epigastric pain with nausea and vomiting.  He does endorse marijuana use yesterday.  Denies chest pain, shortness of breath, headache, fever or other systemic symptoms.   Past Medical History Past Medical History:  Diagnosis Date   Closed fibular fracture 05/07/2012   right distal fib. - sledding accident   Seizures [facility]) age 25   febrile seizure x 1   Patient Active Problem List   Diagnosis Date Noted   Protein-calorie malnutrition, severe 05/11/2020   Intractable nausea and vomiting 05/10/2020   Marijuana abuse 05/10/2020   Hypokalemia 05/10/2020   Prolonged QT interval 05/10/2020   Hyponatremia 05/10/2020   Hyperglycemia 05/10/2020   Transaminitis 05/10/2020   Hyperbilirubinemia 05/10/2020   AKI (acute kidney injury) ([place]) 05/05/2020   Abdominal pain 05/05/2020   Hypercalcemia 05/05/2020   Leukocytosis 05/05/2020   History of marijuana use 11/04/2018   Intractable vomiting 11/04/2018   Closed fibular fracture    Concussion without loss of consciousness 12/21/2010   Home Medication(s) Prior to Admission medications   Medication Sig Start Date End Date Taking? Authorizing Provider  albuterol (VENTOLIN HFA) 108 (90 Base) MCG/ACT inhaler Inhale 2 puffs into the lungs every 6 (six) hours as needed for wheezing or shortness of breath. Patient not taking: No sig reported 03/15/19   Evelina Dun A, FNP  alum & mag hydroxide-simeth (MAALOX/MYLANTA) 200-200-20 MG/5ML suspension Take 15 mLs by mouth every  4 (four) hours as needed for indigestion or heartburn. 05/06/20   Aline August, MD  dicyclomine (BENTYL) 20 MG tablet Take 1 tablet (20 mg total) by mouth 3 (three) times daily as needed for spasms. 05/06/20   Aline August, MD  ondansetron (ZOFRAN) 4 MG tablet Take 1 tablet (4 mg total) by mouth every 6 (six) hours as needed for nausea. 05/06/20   Aline August, MD  pantoprazole (PROTONIX) 40 MG tablet Take 1 tablet (40 mg total) by mouth daily. 05/06/20 06/05/20  Aline August, MD  prochlorperazine (COMPAZINE) 10 MG tablet Take 0.5-1 tablets (5-10 mg total) by mouth every 6 (six) hours as needed for nausea or vomiting. 05/12/20   Caren Griffins, MD  traMADol (ULTRAM) 50 MG tablet Take 1 tablet (50 mg total) by mouth every 6 (six) hours as needed for moderate pain. 05/06/20   Aline August, MD  Past Surgical History Past Surgical History:  Procedure Laterality Date   BIOPSY  05/11/2020   Procedure: BIOPSY;  Surgeon: Ladene Artist, MD;  Location: [facility] ENDOSCOPY;  Service: Endoscopy;;   ESOPHAGOGASTRODUODENOSCOPY (EGD) WITH PROPOFOL N/A 05/11/2020   Procedure: ESOPHAGOGASTRODUODENOSCOPY (EGD) WITH PROPOFOL;  Surgeon: Ladene Artist, MD;  Location: [facility] ENDOSCOPY;  Service: Endoscopy;  Laterality: N/A;   ORIF FIBULA FRACTURE Right 05/19/2012   Procedure: OPEN REDUCTION INTERNAL FIXATION (ORIF) FIBULA FRACTURE;  Surgeon: Lorn Junes, MD;  Location: [place];  Service: Orthopedics;  Laterality: Right;   Family History Family History  Problem Relation Age of Onset   Diabetes Maternal Aunt        2 aunts   Hypertension Maternal Aunt    Heart disease Maternal Aunt        valve replacement   Diabetes Maternal Grandmother     Social History Social History   Tobacco Use   Smoking status: Some Days    Types: E-cigarettes   Smokeless tobacco:  Never   Tobacco comments:    mother smokes outside  Vaping Use   Vaping Use: Some days  Substance Use Topics   Alcohol use: No   Drug use: Yes    Types: Marijuana   Allergies Patient has no known allergies.  Review of Systems Review of Systems  Gastrointestinal:  Positive for abdominal pain, nausea and vomiting.    Physical Exam Vital Signs  I have reviewed the triage vital signs BP (!) 148/95 (BP Location: Right Arm)   Pulse 96   Temp 100.3 F (37.9 C) (Oral)   Resp (!) 22   Ht '5\' 9"'  (1.753 m)   Wt 81.6 kg   SpO2 100%   BMI 26.58 kg/m   Physical Exam Vitals and nursing note reviewed.  Constitutional:      General: He is not in acute distress.    Appearance: He is well-developed.  HENT:     Head: Normocephalic and atraumatic.  Eyes:     Conjunctiva/sclera: Conjunctivae normal.  Cardiovascular:     Rate and Rhythm: Normal rate and regular rhythm.     Heart sounds: No murmur heard. Pulmonary:     Effort: Pulmonary effort is normal. No respiratory distress.     Breath sounds: Normal breath sounds.  Abdominal:     Palpations: Abdomen is soft.     Tenderness: There is abdominal tenderness.  Musculoskeletal:        General: No swelling.     Cervical back: Neck supple.  Skin:    General: Skin is warm and dry.     Capillary Refill: Capillary refill takes less than 2 seconds.  Neurological:     Mental Status: He is alert.  Psychiatric:        Mood and Affect: Mood normal.     ED Results and Treatments Labs (all labs ordered are listed, but only abnormal results are displayed) Labs Reviewed  CBC WITH DIFFERENTIAL/PLATELET - Abnormal; Notable for the following components:      Result Value   WBC 17.5 (*)    Hemoglobin 17.2 (*)    Neutro Abs 15.6 (*)    Abs Immature Granulocytes 0.08 (*)    All other components within normal limits  COMPREHENSIVE METABOLIC PANEL  LIPASE, BLOOD  Radiology US Abdomen Limited RUQ (LIVER/GB)  Result Date: 12/19/2021 CLINICAL DATA:  Right upper quadrant abdominal pain. EXAM: ULTRASOUND ABDOMEN LIMITED RIGHT UPPER QUADRANT COMPARISON:  May 12, 2020. FINDINGS: Gallbladder: No gallstones or wall thickening visualized. No sonographic Murphy sign noted by sonographer. Common bile duct: Diameter: 3 mm which is within normal limits. Liver: No focal lesion identified. Within normal limits in parenchymal echogenicity. Portal vein is patent on color Doppler imaging with normal direction of blood flow towards the liver. Other: None. IMPRESSION: No definite abnormality seen in the right upper quadrant of the abdomen. Electronically Signed   By: Marijo Conception M.D.   On: 12/19/2021 08:13    Pertinent labs & imaging results that were available during my care of the patient were reviewed by me and considered in my medical decision making (see MDM for details).  Medications Ordered in ED Medications  ondansetron (ZOFRAN-ODT) disintegrating tablet 4 mg (4 mg Oral Given 12/19/21 0713)                                                                                                                                     Procedures Procedures  (including critical care time)  Medical Decision Making / ED Course   This patient presents to the ED for concern of abdominal pain, nausea, vomiting, this involves an extensive number of treatment options, and is a complaint that carries with it a high risk of complications and morbidity.  The differential diagnosis includes cholecystitis, choledocholithiasis, cholangitis, pancreatitis, cannabinoid hyperemesis  MDM: Patient seen in the emergency department for evaluation of abdominal pain nausea and vomiting.  Physical exam reveals a uncomfortable appearing patient with active abdominal pain nausea and vomiting.  Tenderness in the epigastrium.  Quadrant ultrasound  unremarkable.  Laboratory evaluation with a leukocytosis to 17.5 likely elevated in setting of stress demargination from vomiting, lipase normal, alk phos minimally elevated at 134 with a total bili of 2.3.  Lactic acid 2.0 and fluid resuscitation begun.  CT abdomen pelvis unremarkable ECG does not show prolonged QT at this time and thus patient safe for droperidol and Ativan of which he received with symptomatic improvement.  Patient able to tolerate p.o. on reevaluation.  Suspect cannabinol hyperemesis and patient discharged in the care of his mother.   Additional history obtained: -Additional history obtained from mother -External records from outside source obtained and reviewed including: Chart review including previous notes, labs, imaging, consultation notes   Lab Tests: -I ordered, reviewed, and interpreted labs.   The pertinent results include:   Labs Reviewed  CBC WITH DIFFERENTIAL/PLATELET - Abnormal; Notable for the following components:      Result Value   WBC 17.5 (*)    Hemoglobin 17.2 (*)    Neutro Abs 15.6 (*)    Abs Immature Granulocytes 0.08 (*)    All other components within normal limits  COMPREHENSIVE METABOLIC PANEL  LIPASE, BLOOD  EKG   EKG Interpretation  Date/Time:  Tuesday December 19 2021 09:09:23 EDT Ventricular Rate:  49 PR Interval:  152 QRS Duration: 108 QT Interval:  418 QTC Calculation: 377 R Axis:   114 Text Interpretation: Sinus bradycardia When compared with ECG of 11-May-2020 13:05, PREVIOUS ECG IS PRESENT Confirmed by [place] ([id number]) on 12/19/2021 9:47:47 AM         Imaging Studies ordered: I ordered imaging studies including right upper quadrant ultrasound, CT AP I independently visualized and interpreted imaging. I agree with the radiologist interpretation   Medicines ordered and prescription drug management: Meds ordered this encounter  Medications   ondansetron (ZOFRAN-ODT) disintegrating tablet 4 mg    -I have  reviewed the patients home medicines and have made adjustments as needed  Critical interventions none    Cardiac Monitoring: The patient was maintained on a cardiac monitor.  I personally viewed and interpreted the cardiac monitored which showed an underlying rhythm of: NSR, sinus bradycardia  Social Determinants of Health:  Factors impacting patients care include: Marijuana use   Reevaluation: After the interventions noted above, I reevaluated the patient and found that they have :improved  Co morbidities that complicate the patient evaluation  Past Medical History:  Diagnosis Date   Closed fibular fracture 05/07/2012   right distal fib. - sledding accident   Seizures [facility]) age 39   febrile seizure x 1      Dispostion: I considered admission for this patient, but he currently does not meet inpatient criteria for admission and is safe for discharge with outpatient follow-up     Final Clinical Impression(s) / ED Diagnoses Final diagnoses:  None     '@PCDICTATION' @    Teressa Lower, MD 12/19/21 1544

## 2021-12-19 NOTE — ED Notes (Signed)
Patient transported to CT 

## 2021-12-19 NOTE — ED Provider Triage Note (Signed)
Emergency Medicine Provider Triage Evaluation Note  Tom Jones , a 26 y.o. male  was evaluated in triage.  Pt complains of right upper abdominal pain that started yesterday with greater than 10 episodes of NBNB emesis since that time.  History of gallbladder pain in the past.  States this started postprandially and feels similar to his gallbladder related pain in the past.  Review of Systems  Positive: As above Negative: Diarrhea fevers chills nausea vomiting dysuria  Physical Exam  BP (!) 148/95 (BP Location: Right Arm)   Pulse 96   Temp 100.3 F (37.9 C) (Oral)   Resp (!) 22   Ht 5\' 9"  (1.753 m)   Wt 81.6 kg   SpO2 100%   BMI 26.58 kg/m  Gen:   Awake, no distress   Resp:  Normal effort  MSK:   Moves extremities without difficulty  Other:  + Murphy's sign, epigastric TTP lungs CTAB  Medical Decision Making  Medically screening exam initiated at 6:51 AM.  Appropriate orders placed.  CHRISTOHER DRUDGE was informed that the remainder of the evaluation will be completed by another provider, this initial triage assessment does not replace that evaluation, and the importance of remaining in the ED until their evaluation is complete.  This chart was dictated using voice recognition software, Dragon. Despite the best efforts of this provider to proofread and correct errors, errors may still occur which can change documentation meaning.    Emeline Darling, PA-C 12/19/21 [phone]

## 2021-12-19 NOTE — ED Triage Notes (Addendum)
Pt reports he is concerned for flare-up from gall bladder after eating tomato sauce, pt reports after eating it he has had abd pain and 15-20 episodes of emesis

## 2021-12-22 ENCOUNTER — Emergency Department (HOSPITAL_BASED_OUTPATIENT_CLINIC_OR_DEPARTMENT_OTHER): Payer: Self-pay

## 2021-12-22 ENCOUNTER — Encounter (HOSPITAL_COMMUNITY): Payer: Self-pay | Admitting: *Deleted

## 2021-12-22 ENCOUNTER — Inpatient Hospital Stay (HOSPITAL_BASED_OUTPATIENT_CLINIC_OR_DEPARTMENT_OTHER)
Admission: EM | Admit: 2021-12-22 | Discharge: 2021-12-25 | DRG: 917 | Disposition: A | Discharge status: Home / Self Care | Payer: Self-pay | Attending: Internal Medicine | Admitting: Internal Medicine

## 2021-12-22 ENCOUNTER — Encounter (HOSPITAL_COMMUNITY): Payer: Self-pay

## 2021-12-22 ENCOUNTER — Other Ambulatory Visit: Payer: Self-pay

## 2021-12-22 ENCOUNTER — Ambulatory Visit (HOSPITAL_COMMUNITY)
Admission: EM | Admit: 2021-12-22 | Discharge: 2021-12-22 | Disposition: A | Discharge status: Home / Self Care | Payer: Self-pay

## 2021-12-22 ENCOUNTER — Encounter (HOSPITAL_BASED_OUTPATIENT_CLINIC_OR_DEPARTMENT_OTHER): Payer: Self-pay | Admitting: Emergency Medicine

## 2021-12-22 ENCOUNTER — Emergency Department (HOSPITAL_COMMUNITY)
Admission: EM | Admit: 2021-12-22 | Discharge: 2021-12-22 | Discharge status: Against Medical Advice | Payer: Self-pay | Attending: Emergency Medicine | Admitting: Emergency Medicine

## 2021-12-22 DIAGNOSIS — E871 Hypo-osmolality and hyponatremia: Secondary | ICD-10-CM | POA: Diagnosis present

## 2021-12-22 DIAGNOSIS — Z5321 Procedure and treatment not carried out due to patient leaving prior to being seen by health care provider: Secondary | ICD-10-CM | POA: Insufficient documentation

## 2021-12-22 DIAGNOSIS — N179 Acute kidney failure, unspecified: Secondary | ICD-10-CM | POA: Diagnosis present

## 2021-12-22 DIAGNOSIS — R109 Unspecified abdominal pain: Secondary | ICD-10-CM | POA: Insufficient documentation

## 2021-12-22 DIAGNOSIS — R17 Unspecified jaundice: Secondary | ICD-10-CM

## 2021-12-22 DIAGNOSIS — R1084 Generalized abdominal pain: Secondary | ICD-10-CM

## 2021-12-22 DIAGNOSIS — Z8249 Family history of ischemic heart disease and other diseases of the circulatory system: Secondary | ICD-10-CM

## 2021-12-22 DIAGNOSIS — R9431 Abnormal electrocardiogram [ECG] [EKG]: Secondary | ICD-10-CM | POA: Diagnosis present

## 2021-12-22 DIAGNOSIS — E860 Dehydration: Secondary | ICD-10-CM | POA: Diagnosis present

## 2021-12-22 DIAGNOSIS — Z79899 Other long term (current) drug therapy: Secondary | ICD-10-CM

## 2021-12-22 DIAGNOSIS — Z1152 Encounter for screening for COVID-19: Secondary | ICD-10-CM

## 2021-12-22 DIAGNOSIS — Z833 Family history of diabetes mellitus: Secondary | ICD-10-CM

## 2021-12-22 DIAGNOSIS — F1210 Cannabis abuse, uncomplicated: Secondary | ICD-10-CM | POA: Diagnosis present

## 2021-12-22 DIAGNOSIS — T40711A Poisoning by cannabis, accidental (unintentional), initial encounter: Principal | ICD-10-CM | POA: Diagnosis present

## 2021-12-22 DIAGNOSIS — F17290 Nicotine dependence, other tobacco product, uncomplicated: Secondary | ICD-10-CM | POA: Diagnosis present

## 2021-12-22 DIAGNOSIS — R1013 Epigastric pain: Secondary | ICD-10-CM

## 2021-12-22 DIAGNOSIS — R112 Nausea with vomiting, unspecified: Secondary | ICD-10-CM

## 2021-12-22 DIAGNOSIS — K831 Obstruction of bile duct: Secondary | ICD-10-CM

## 2021-12-22 DIAGNOSIS — E876 Hypokalemia: Secondary | ICD-10-CM | POA: Diagnosis present

## 2021-12-22 LAB — URINALYSIS, ROUTINE W REFLEX MICROSCOPIC
Bilirubin Urine: NEGATIVE
Glucose, UA: NEGATIVE mg/dL
Glucose, UA: NEGATIVE mg/dL
Hgb urine dipstick: NEGATIVE
Ketones, ur: 5 mg/dL — AB
Ketones, ur: 40 mg/dL — AB
Leukocytes,Ua: NEGATIVE
Leukocytes,Ua: NEGATIVE
Nitrite: NEGATIVE
Nitrite: NEGATIVE
Protein, ur: >=300 mg/dL — AB
Protein, ur: >300 mg/dL — AB
Specific Gravity, Urine: 1.031 — ABNORMAL HIGH (ref 1.005–1.030)
Specific Gravity, Urine: 1.035 — ABNORMAL HIGH (ref 1.005–1.030)
pH: 5 (ref 5.0–8.0)
pH: 6 (ref 5.0–8.0)

## 2021-12-22 LAB — COMPREHENSIVE METABOLIC PANEL
ALT: 60 U/L — ABNORMAL HIGH (ref 0–44)
ALT: 59 U/L — ABNORMAL HIGH (ref 0–44)
ALT: 53 U/L — ABNORMAL HIGH (ref 0–44)
AST: 46 U/L — ABNORMAL HIGH (ref 15–41)
AST: 38 U/L (ref 15–41)
AST: 37 U/L (ref 15–41)
Albumin: 5.2 g/dL — ABNORMAL HIGH (ref 3.5–5.0)
Albumin: 5.4 g/dL — ABNORMAL HIGH (ref 3.5–5.0)
Albumin: 4 g/dL (ref 3.5–5.0)
Alkaline Phosphatase: 123 U/L (ref 38–126)
Alkaline Phosphatase: 125 U/L (ref 38–126)
Alkaline Phosphatase: 102 U/L (ref 38–126)
Anion gap: 18 — ABNORMAL HIGH (ref 5–15)
Anion gap: 23 — ABNORMAL HIGH (ref 5–15)
Anion gap: 10 (ref 5–15)
BUN: 38 mg/dL — ABNORMAL HIGH (ref 6–20)
BUN: 45 mg/dL — ABNORMAL HIGH (ref 6–20)
BUN: 31 mg/dL — ABNORMAL HIGH (ref 6–20)
CO2: 22 mmol/L (ref 22–32)
CO2: 21 mmol/L — ABNORMAL LOW (ref 22–32)
CO2: 28 mmol/L (ref 22–32)
Calcium: 10.1 mg/dL (ref 8.9–10.3)
Calcium: 10.7 mg/dL — ABNORMAL HIGH (ref 8.9–10.3)
Calcium: 8.7 mg/dL — ABNORMAL LOW (ref 8.9–10.3)
Chloride: 92 mmol/L — ABNORMAL LOW (ref 98–111)
Chloride: 90 mmol/L — ABNORMAL LOW (ref 98–111)
Chloride: 97 mmol/L — ABNORMAL LOW (ref 98–111)
Creatinine, Ser: 1.53 mg/dL — ABNORMAL HIGH (ref 0.61–1.24)
Creatinine, Ser: 1.17 mg/dL (ref 0.61–1.24)
Creatinine, Ser: 0.85 mg/dL (ref 0.61–1.24)
GFR, Estimated: >60 mL/min (ref >60)
GFR, Estimated: >60 mL/min (ref >60)
GFR, Estimated: >60 mL/min (ref >60)
Glucose, Bld: 119 mg/dL — ABNORMAL HIGH (ref 70–99)
Glucose, Bld: 105 mg/dL — ABNORMAL HIGH (ref 70–99)
Glucose, Bld: 100 mg/dL — ABNORMAL HIGH (ref 70–99)
Potassium: 2.8 mmol/L — ABNORMAL LOW (ref 3.5–5.1)
Potassium: 2.8 mmol/L — ABNORMAL LOW (ref 3.5–5.1)
Potassium: 2.9 mmol/L — ABNORMAL LOW (ref 3.5–5.1)
Sodium: 132 mmol/L — ABNORMAL LOW (ref 135–145)
Sodium: 134 mmol/L — ABNORMAL LOW (ref 135–145)
Sodium: 135 mmol/L (ref 135–145)
Total Bilirubin: 5.6 mg/dL — ABNORMAL HIGH (ref 0.3–1.2)
Total Bilirubin: 5.7 mg/dL — ABNORMAL HIGH (ref 0.3–1.2)
Total Bilirubin: 4.7 mg/dL — ABNORMAL HIGH (ref 0.3–1.2)
Total Protein: 9.3 g/dL — ABNORMAL HIGH (ref 6.5–8.1)
Total Protein: 9 g/dL — ABNORMAL HIGH (ref 6.5–8.1)
Total Protein: 7.2 g/dL (ref 6.5–8.1)

## 2021-12-22 LAB — RESP PANEL BY RT-PCR (FLU A&B, COVID) ARPGX2
Influenza A by PCR: NEGATIVE
Influenza B by PCR: NEGATIVE
SARS Coronavirus 2 by RT PCR: NEGATIVE

## 2021-12-22 LAB — MAGNESIUM
Magnesium: 2.5 mg/dL — ABNORMAL HIGH (ref 1.7–2.4)
Magnesium: 2.3 mg/dL (ref 1.7–2.4)

## 2021-12-22 LAB — CBC WITH DIFFERENTIAL/PLATELET
Abs Immature Granulocytes: 0.03 10*3/uL (ref 0.00–0.07)
Abs Immature Granulocytes: 0.03 10*3/uL (ref 0.00–0.07)
Basophils Absolute: 0.1 10*3/uL (ref 0.0–0.1)
Basophils Absolute: 0.1 10*3/uL (ref 0.0–0.1)
Basophils Relative: 1 %
Basophils Relative: 1 %
Eosinophils Absolute: 0 10*3/uL (ref 0.0–0.5)
Eosinophils Absolute: 0 10*3/uL (ref 0.0–0.5)
Eosinophils Relative: 0 %
Eosinophils Relative: 0 %
HCT: 52.9 % — ABNORMAL HIGH (ref 39.0–52.0)
HCT: 52.5 % — ABNORMAL HIGH (ref 39.0–52.0)
Hemoglobin: 19.2 g/dL — ABNORMAL HIGH (ref 13.0–17.0)
Hemoglobin: 19.3 g/dL — ABNORMAL HIGH (ref 13.0–17.0)
Immature Granulocytes: 0 %
Immature Granulocytes: 0 %
Lymphocytes Relative: 26 %
Lymphocytes Relative: 20 %
Lymphs Abs: 2.8 10*3/uL (ref 0.7–4.0)
Lymphs Abs: 2.2 10*3/uL (ref 0.7–4.0)
MCH: 30.5 pg (ref 26.0–34.0)
MCH: 31.1 pg (ref 26.0–34.0)
MCHC: 36.3 g/dL — ABNORMAL HIGH (ref 30.0–36.0)
MCHC: 36.8 g/dL — ABNORMAL HIGH (ref 30.0–36.0)
MCV: 84.1 fL (ref 80.0–100.0)
MCV: 84.7 fL (ref 80.0–100.0)
Monocytes Absolute: 1.4 10*3/uL — ABNORMAL HIGH (ref 0.1–1.0)
Monocytes Absolute: 1.4 10*3/uL — ABNORMAL HIGH (ref 0.1–1.0)
Monocytes Relative: 13 %
Monocytes Relative: 13 %
Neutro Abs: 6.4 10*3/uL (ref 1.7–7.7)
Neutro Abs: 7.3 10*3/uL (ref 1.7–7.7)
Neutrophils Relative %: 60 %
Neutrophils Relative %: 66 %
Platelets: 345 10*3/uL (ref 150–400)
Platelets: 312 10*3/uL (ref 150–400)
RBC: 6.29 MIL/uL — ABNORMAL HIGH (ref 4.22–5.81)
RBC: 6.2 MIL/uL — ABNORMAL HIGH (ref 4.22–5.81)
RDW: 11.5 % (ref 11.5–15.5)
RDW: 11.9 % (ref 11.5–15.5)
WBC: 10.7 10*3/uL — ABNORMAL HIGH (ref 4.0–10.5)
WBC: 11.1 10*3/uL — ABNORMAL HIGH (ref 4.0–10.5)
nRBC: 0 % (ref 0.0–0.2)
nRBC: 0 % (ref 0.0–0.2)

## 2021-12-22 LAB — LIPASE, BLOOD
Lipase: 29 U/L (ref 11–51)
Lipase: 17 U/L (ref 11–51)

## 2021-12-22 LAB — CBG MONITORING, ED: Glucose-Capillary: 103 mg/dL — ABNORMAL HIGH (ref 70–99)

## 2021-12-22 MED ORDER — LORAZEPAM 2 MG/ML IJ SOLN
0.5000 mg | Freq: Once | INTRAMUSCULAR | Status: AC
  Administered 2021-12-22: 0.5 mg via INTRAVENOUS
  Filled 2021-12-22: qty 1

## 2021-12-22 MED ORDER — LACTATED RINGERS IV BOLUS
1000.0000 mL | Freq: Once | INTRAVENOUS | Status: AC
  Administered 2021-12-22: 1000 mL via INTRAVENOUS

## 2021-12-22 MED ORDER — MORPHINE SULFATE (PF) 4 MG/ML IV SOLN
4.0000 mg | Freq: Once | INTRAVENOUS | Status: AC
  Administered 2021-12-22: 4 mg via INTRAVENOUS
  Filled 2021-12-22: qty 1

## 2021-12-22 MED ORDER — POTASSIUM CHLORIDE 10 MEQ/100ML IV SOLN
10.0000 meq | INTRAVENOUS | Status: AC
  Administered 2021-12-22 – 2021-12-23 (×5): 10 meq via INTRAVENOUS
  Filled 2021-12-22 (×6): qty 100

## 2021-12-22 NOTE — ED Triage Notes (Signed)
Patient  c/o abd. Pain was seen on Monday states he is still having abd. Pain states he thinks he needs his gallbladder removed/

## 2021-12-22 NOTE — ED Provider Notes (Signed)
[facility] EMERGENCY DEPT Provider Note   CSN: [id number] Arrival date & time: 12/22/21  1319     History Chief Complaint  Patient presents with   Abdominal Pain    OLSON LUCARELLI is a 26 y.o. male with history of marijuana abuse and intractable nausea and vomiting presents the emergency department for evaluation of nausea, vomiting, and abdominal pain since Monday (5 days).  He denies any coffee-ground emesis or hematemesis.  Denies any diarrhea, constipation, dysuria, hematuria, chest pain, shortness of breath, or fever.  He reports he has been having some chills and body aches.  Reports he is only urinating once a day.  He has been unable to tolerate any food or drink.  He reports his pain is mainly upper.  He was seen in the emergency department on 12-19-2021 and was discharged and since then has still had continued nausea and vomiting.  Reports he took some Tylenol yesterday but does not know if he kept it down due to his vomiting.  Daily marijuana use.   Abdominal Pain Associated symptoms: chills, nausea and vomiting   Associated symptoms: no chest pain, no constipation, no diarrhea, no dysuria, no fever, no hematuria and no shortness of breath        Home Medications Prior to Admission medications   Medication Sig Start Date End Date Taking? Authorizing Provider  albuterol (VENTOLIN HFA) 108 (90 Base) MCG/ACT inhaler Inhale 2 puffs into the lungs every 6 (six) hours as needed for wheezing or shortness of breath. Patient not taking: No sig reported 03/15/19   Evelina Dun A, FNP  alum & mag hydroxide-simeth (MAALOX/MYLANTA) 200-200-20 MG/5ML suspension Take 15 mLs by mouth every 4 (four) hours as needed for indigestion or heartburn. 05/06/20   Aline August, MD  dicyclomine (BENTYL) 20 MG tablet Take 1 tablet (20 mg total) by mouth 3 (three) times daily as needed for spasms. 05/06/20   Aline August, MD  ondansetron (ZOFRAN) 4 MG tablet Take 1 tablet (4 mg total)  by mouth every 6 (six) hours as needed for nausea. 05/06/20   Aline August, MD  pantoprazole (PROTONIX) 40 MG tablet Take 1 tablet (40 mg total) by mouth daily. 05/06/20 06/05/20  Aline August, MD  prochlorperazine (COMPAZINE) 10 MG tablet Take 0.5-1 tablets (5-10 mg total) by mouth every 6 (six) hours as needed for nausea or vomiting. 05/12/20   Caren Griffins, MD  traMADol (ULTRAM) 50 MG tablet Take 1 tablet (50 mg total) by mouth every 6 (six) hours as needed for moderate pain. 05/06/20   Aline August, MD      Allergies    Patient has no known allergies.    Review of Systems   Review of Systems  Constitutional:  Positive for chills. Negative for fever.  HENT:  Negative for congestion and rhinorrhea.   Respiratory:  Negative for shortness of breath.   Cardiovascular:  Negative for chest pain.  Gastrointestinal:  Positive for abdominal pain, nausea and vomiting. Negative for blood in stool, constipation and diarrhea.  Genitourinary:  Positive for decreased urine volume. Negative for dysuria, frequency, hematuria and urgency.  Musculoskeletal:  Positive for myalgias. Negative for back pain.  Neurological:  Negative for weakness and headaches.    Physical Exam Updated Vital Signs BP (!) 121/99   Pulse 93   Temp 97.6 F (36.4 C) (Oral)   Resp 17   SpO2 99%  Physical Exam Vitals and nursing note reviewed.  Constitutional:      Appearance: Normal  appearance. He is not toxic-appearing.     Comments: Uncomfortable, but nontoxic-appearing  HENT:     Head: Normocephalic and atraumatic.     Mouth/Throat:     Mouth: Mucous membranes are dry.  Eyes:     General: No scleral icterus. Cardiovascular:     Rate and Rhythm: Normal rate and regular rhythm.  Pulmonary:     Effort: Pulmonary effort is normal.     Breath sounds: Normal breath sounds.  Abdominal:     General: Abdomen is flat. Bowel sounds are normal. There is no distension.     Palpations: Abdomen is soft.      Tenderness: There is abdominal tenderness in the right upper quadrant, epigastric area and left upper quadrant. There is no right CVA tenderness, left CVA tenderness, guarding or rebound.     Comments: Diffuse upper abdominal tenderness, mainly epigastric.  No overlying skin changes noted.  Normal active bowel sounds.  No guarding or rebound noted.  No CVA tenderness noted.  Musculoskeletal:        General: No deformity.     Cervical back: Normal range of motion.  Skin:    General: Skin is warm and dry.  Neurological:     General: No focal deficit present.     Mental Status: He is alert. Mental status is at baseline.     ED Results / Procedures / Treatments   Labs (all labs ordered are listed, but only abnormal results are displayed) Labs Reviewed  CBC WITH DIFFERENTIAL/PLATELET  COMPREHENSIVE METABOLIC PANEL  LIPASE, BLOOD  URINALYSIS, ROUTINE W REFLEX MICROSCOPIC    EKG EKG Interpretation  Date/Time:  Friday December 22 2021 15:59:49 EDT Ventricular Rate:  106 PR Interval:  142 QRS Duration: 100 QT Interval:  391 QTC Calculation: 520 R Axis:   96 Text Interpretation: Sinus tachycardia Biatrial enlargement Borderline right axis deviation Minimal ST depression, inferior leads ST elevation, likely early repol Prolonged QT interval Confirmed by Sherwood Gambler [phone]) on 12/22/2021 5:14:18 PM  Radiology US Abdomen Limited RUQ (LIVER/GB)  Result Date: 12/22/2021 CLINICAL DATA:  Epigastric pain EXAM: ULTRASOUND ABDOMEN LIMITED RIGHT UPPER QUADRANT COMPARISON:  Ultrasound 12/19/2021, CT 12/19/2021, ultrasound 05/12/2020 FINDINGS: Gallbladder: No gallstones or wall thickening visualized. No sonographic Murphy sign noted by sonographer. Common bile duct: Diameter: 3 mm Liver: Echogenicity within normal limits. Echogenic area in the left hepatic lobe measuring 2.7 x 1 x 1.5 cm. This is stable in appearance compared with ultrasound from February 2022. Portal vein is patent on color  Doppler imaging with normal direction of blood flow towards the liver. Other: None. IMPRESSION: 1. Negative for gallstones 2. 2.7 cm echogenic area in the left hepatic lobe, could correspond to possible focal fat infiltration near falciform ligament versus hemangioma, this is felt stable to slightly decreased as compared to 05/12/2020. Electronically Signed   By: Donavan Foil M.D.   On: 12/22/2021 16:40    Procedures Procedures   Medications Ordered in ED Medications  potassium chloride 10 mEq in 100 mL IVPB (10 mEq Intravenous New Bag/Given 12/22/21 2224)  lactated ringers bolus 1,000 mL (0 mLs Intravenous Stopped 12/22/21 1733)  LORazepam (ATIVAN) injection 0.5 mg (0.5 mg Intravenous Given 12/22/21 1633)  lactated ringers bolus 1,000 mL (0 mLs Intravenous Stopped 12/22/21 1841)  LORazepam (ATIVAN) injection 0.5 mg (0.5 mg Intravenous Given 12/22/21 1825)  lactated ringers bolus 1,000 mL (1,000 mLs Intravenous New Bag/Given 12/22/21 1843)  morphine (PF) 4 MG/ML injection 4 mg (4 mg Intravenous Given 12/22/21 1958)  ED Course/ Medical Decision Making/ A&P                           Medical Decision Making Amount and/or Complexity of Data Reviewed Labs: ordered. Radiology: ordered.  Risk Prescription drug management. Decision regarding hospitalization.   26 year old male presents the emergency room for evaluation of continued nausea, vomiting, and upper abdominal pain for the past 5 days.  Differential diagnosis includes was not limited to gastritis, pancreatitis, cholecystitis, cholelithiasis, choledocholithiasis, cholangitis, cannabinoid induced hyperemesis syndrome.  Vital signs show slightly elevated blood pressure and slight tachycardia otherwise afebrile, satting well on room air without increased work of breathing.  Physical exam as noted above.  Labs ordered.  On evaluation, will order right upper quadrant ultrasound given patient's increase in bilirubin.  After discussion with my  attending, he does not see a repeat CT is necessary.  I independent reviewed and interpreted the patient's labs.  Urinalysis shows concentrated urine with trace amount of hemoglobin, small bilirubin, 40 ketones, and greater then 30 proteins.  There are some white blood cells seen however no leukocytes or nitrites.  Magnesium elevated at 2.5.  Lipase normal.  COVID and flu negative.  CBC shows leukocytosis at 11.1 although I likely think this is hemoconcentration given the patient's dehydration given his hemoglobin is 19.3 with an increased hematocrit as well.  CMP shows mild hyponatremia at 134.  Hypokalemia at 2.8.  Chloride decreased at 90.  Bicarb at 21 with an anion gap of 23.  BUN elevated at 45 which is higher than patient's previous.  Creatinine at 1.17 is improved from his previous.  Elevated calcium, protein, and albumin, again likely due to patient's dehydration.  ALT at 59 with AST normal.  Alk phos normal.  Elevated total bili at 5.7 with his baseline being around 2.3.  Right upper quadrant ultrasound shows  1. Negative for gallstones 2. 2.7 cm echogenic area in the left hepatic lobe, could correspond to possible focal fat infiltration near falciform ligament versus hemangioma, this is felt stable to slightly decreased as compared to 05/12/2020.  Patient was given 2 L of LR as well as morphine, Ativan 0.5x2 and potassium 10 mEq IV every hour x6.  Given this is his second ER visit in the week and is still had worsening nausea, vomiting, abdominal pain as well as his worsening electrolyte derangement and elevated total bili, think this patient would benefit from admission with continued IV fluids and possible MRCP for evaluation of his elevated total bili.  Discussed with patient is amenable for admission.  Discussed with Dr. Josephine Cables whos admits the patient.   I discussed this case with my attending physician who cosigned this note including patient's presenting symptoms, physical exam, and  planned diagnostics and interventions. Attending physician stated agreement with plan or made changes to plan which were implemented.   Final Clinical Impression(s) / ED Diagnoses Final diagnoses:  Dehydration  Elevated bilirubin  Hypokalemia  Nausea and vomiting, unspecified vomiting type  Epigastric pain  Prolonged QT interval    Rx / DC Orders ED Discharge Orders     None         Sherrell Puller, PA-C 12/22/21 2330    Sherwood Gambler, MD 12/27/21 [phone]

## 2021-12-22 NOTE — Assessment & Plan Note (Signed)
Repeat CMP, magnesium.  Pt only had 20 meq IV KCl given in ER.

## 2021-12-22 NOTE — ED Triage Notes (Signed)
Abd pain with vomiting for several days. Was at [facility] today and left before being seen. Was also seen Monday for same.

## 2021-12-22 NOTE — Progress Notes (Signed)
Pt arrived to room lethargic after have receiving 4mg  of morphine at [place]. Completed assessment but pt too lethargic to answer questions at this time.

## 2021-12-22 NOTE — ED Notes (Signed)
Providers recommending pt to be evaluated in the emergency room.

## 2021-12-22 NOTE — Assessment & Plan Note (Signed)
Magnesium level was 2.5.  Repeat BMP. Given more KCl if needed. Will need to use Tigan for N/V.

## 2021-12-22 NOTE — ED Provider Triage Note (Signed)
Emergency Medicine Provider Triage Evaluation Note  Tom Jones , a 26 y.o. male  was evaluated in triage.  Pt complains of abdominal pain, nausea, vomiting since Monday.  He has history of recurrent abdominal pain.  Recently evaluated in the emergency room on 9/26 had CT abdomen pelvis as well as right upper quadrant ultrasound performed.  No acute process was found.  Does have history of marijuana use.  Last smoked marijuana on Monday just prior to onset of symptoms.  Denies fever.  Review of Systems  Positive: As above Negative: As above  Physical Exam  There were no vitals taken for this visit. Gen:   Awake, no distress   Resp:  Normal effort  MSK:   Moves extremities without difficulty  Other:    Medical Decision Making  Medically screening exam initiated at 10:13 AM.  Appropriate orders placed.  Tom Jones was informed that the remainder of the evaluation will be completed by another provider, this initial triage assessment does not replace that evaluation, and the importance of remaining in the ED until their evaluation is complete.    Evlyn Courier, PA-C 12/22/21 1014

## 2021-12-22 NOTE — ED Notes (Signed)
IV attempted x1 without success

## 2021-12-22 NOTE — ED Notes (Signed)
Gave report to 5 E RN

## 2021-12-22 NOTE — Subjective & Objective (Signed)
CC: N/V, abd pain HPI: 26 year old African-American male prior history of marijuana abuse, irritable syndrome, takes no medications a regular basis presents to the ER today with a 1 day history of abdominal pain nausea, vomiting.  He was seen about 3 to 4 days ago in the ER.  He had nausea and vomiting related to eating spaghetti.  He states that he "overdid it" with tomato sauce.  He was treated in the ER and sent home.  He woke up today with nausea vomiting abdominal pain.  No diarrhea.  No fevers.  Was seen again in the ER today.  Arrival temp 97.6 heart rate 100 blood pressure 132/107.  Sodium 134, potassium 2.8, chloride 90, bicarb 21, BUN 45, creatinine 1.1 total bili of 5.7 AST of 38, ALT 59  Magnesium 2.5 Lipase of 17 COVID and flu are negative.  WBC 11.1, hemoglobin 19.3, platelets 312  Right upper quadrant ultrasound was negative for any gallbladder wall thickening or gallstones.  His CT scan done on 12/19/2021 showed no acute intra-abdominal process.  Due to patient's continued hypokalemia, [facility] hospitalist contacted for admission.

## 2021-12-22 NOTE — Assessment & Plan Note (Signed)
Observation med tele bed. Continue with IVF. No opiates. protonix IV q12. Clear liquid diet. Checking UDS.

## 2021-12-22 NOTE — ED Notes (Signed)
Attempt unsuccessful, pt will try later have label specimen cup

## 2021-12-22 NOTE — Assessment & Plan Note (Signed)
Checking UDS. Marijuana use could be the reason for his intractable N/V(i.e. cannabis hyperemesis)

## 2021-12-22 NOTE — H&P (Signed)
History and Physical    Tom Jones FAO:[id number] DOB: 16-Oct-1995 DOA: 12/22/2021  DOS: the patient was seen and examined on 12/22/2021  PCP: Claiborne Rigg, NP   Patient coming from: Home  I have personally briefly reviewed patient's old medical records in [place] [facility]  CC: N/V, abd pain HPI: 26 year old African-American male prior history of marijuana abuse, irritable syndrome, takes no medications a regular basis presents to the ER today with a 1 day history of abdominal pain nausea, vomiting.  He was seen about 3 to 4 days ago in the ER.  He had nausea and vomiting related to eating spaghetti.  He states that he "overdid it" with tomato sauce.  He was treated in the ER and sent home.  He woke up today with nausea vomiting abdominal pain.  No diarrhea.  No fevers.  Was seen again in the ER today.  Arrival temp 97.6 heart rate 100 blood pressure 132/107.  Sodium 134, potassium 2.8, chloride 90, bicarb 21, BUN 45, creatinine 1.1 total bili of 5.7 AST of 38, ALT 59  Magnesium 2.5 Lipase of 17 COVID and flu are negative.  WBC 11.1, hemoglobin 19.3, platelets 312  Right upper quadrant ultrasound was negative for any gallbladder wall thickening or gallstones.  His CT scan done on 12/19/2021 showed no acute intra-abdominal process.  Due to patient's continued hypokalemia, [facility] hospitalist contacted for admission.   ED Course: given 3 L of IVF, 20 meq IV kcl given.  Review of Systems:  Review of Systems  Constitutional: Negative.   HENT: Negative.    Eyes: Negative.   Respiratory: Negative.    Cardiovascular: Negative.   Gastrointestinal:  Positive for abdominal pain, nausea and vomiting. Negative for blood in stool, constipation, diarrhea and melena.  Genitourinary: Negative.   Skin: Negative.   Neurological: Negative.   Endo/Heme/Allergies: Negative.   Psychiatric/Behavioral:  Positive for substance abuse.   All other systems reviewed and are negative.   Past  Medical History:  Diagnosis Date   Closed fibular fracture 05/07/2012   right distal fib. - sledding accident   Seizures [facility]) age 32   febrile seizure x 1    Past Surgical History:  Procedure Laterality Date   BIOPSY  05/11/2020   Procedure: BIOPSY;  Surgeon: Meryl Dare, MD;  Location: [facility] ENDOSCOPY;  Service: Endoscopy;;   ESOPHAGOGASTRODUODENOSCOPY (EGD) WITH PROPOFOL N/A 05/11/2020   Procedure: ESOPHAGOGASTRODUODENOSCOPY (EGD) WITH PROPOFOL;  Surgeon: Meryl Dare, MD;  Location: [facility] ENDOSCOPY;  Service: Endoscopy;  Laterality: N/A;   ORIF FIBULA FRACTURE Right 05/19/2012   Procedure: OPEN REDUCTION INTERNAL FIXATION (ORIF) FIBULA FRACTURE;  Surgeon: Nilda Simmer, MD;  Location: [facility];  Service: Orthopedics;  Laterality: Right;     reports that he has been smoking e-cigarettes and cigars. He has never used smokeless tobacco. He reports current alcohol use. He reports current drug use. Drug: Marijuana.  No Known Allergies  Family History  Problem Relation Age of Onset   Diabetes Maternal Aunt        2 aunts   Hypertension Maternal Aunt    Heart disease Maternal Aunt        valve replacement   Diabetes Maternal Grandmother     Prior to Admission medications   Medication Sig Start Date End Date Taking? Authorizing Provider  albuterol (VENTOLIN HFA) 108 (90 Base) MCG/ACT inhaler Inhale 2 puffs into the lungs every 6 (six) hours as needed for wheezing or shortness of breath. Patient  not taking: No sig reported 03/15/19   Evelina Dun A, FNP  alum & mag hydroxide-simeth (MAALOX/MYLANTA) 200-200-20 MG/5ML suspension Take 15 mLs by mouth every 4 (four) hours as needed for indigestion or heartburn. 05/06/20   Aline August, MD  dicyclomine (BENTYL) 20 MG tablet Take 1 tablet (20 mg total) by mouth 3 (three) times daily as needed for spasms. 05/06/20   Aline August, MD  ondansetron (ZOFRAN) 4 MG tablet Take 1 tablet (4 mg total) by mouth every 6 (six)  hours as needed for nausea. 05/06/20   Aline August, MD  pantoprazole (PROTONIX) 40 MG tablet Take 1 tablet (40 mg total) by mouth daily. 05/06/20 06/05/20  Aline August, MD  prochlorperazine (COMPAZINE) 10 MG tablet Take 0.5-1 tablets (5-10 mg total) by mouth every 6 (six) hours as needed for nausea or vomiting. 05/12/20   Caren Griffins, MD  traMADol (ULTRAM) 50 MG tablet Take 1 tablet (50 mg total) by mouth every 6 (six) hours as needed for moderate pain. 05/06/20   Aline August, MD    Physical Exam: Vitals:   12/22/21 1830 12/22/21 1900 12/22/21 1930 12/22/21 2027  BP: (!) 160/111 (!) 155/97 (!) 141/94 (!) 153/102  Pulse: 70 72 98 76  Resp: (!) 22 19 20 20   Temp:   98.6 F (37 C) 98.7 F (37.1 C)  TempSrc:    Oral  SpO2: 95% 98% 100% 100%    Physical Exam Vitals and nursing note reviewed.  Constitutional:      General: He is not in acute distress.    Appearance: He is normal weight. He is not ill-appearing, toxic-appearing or diaphoretic.     Comments: Sedated and falls asleep within 5 secs of verbal stimuli.  HENT:     Head: Normocephalic and atraumatic.     Nose: Nose normal.  Cardiovascular:     Rate and Rhythm: Normal rate and regular rhythm.     Pulses: Normal pulses.  Pulmonary:     Effort: Pulmonary effort is normal. No respiratory distress.     Breath sounds: Normal breath sounds. No wheezing.  Abdominal:     General: Abdomen is flat. Bowel sounds are normal. There is no distension.     Palpations: Abdomen is soft.     Tenderness: There is no abdominal tenderness.     Hernia: No hernia is present.  Musculoskeletal:     Right lower leg: No edema.     Left lower leg: No edema.  Skin:    General: Skin is warm and dry.     Capillary Refill: Capillary refill takes less than 2 seconds.  Neurological:     Mental Status: He is oriented to person, place, and time.      Labs on Admission: I have personally reviewed following labs and imaging  studies  CBC: Recent Labs  Lab 12/19/21 0658 12/22/21 1017 12/22/21 1625  WBC 17.5* 10.7* 11.1*  NEUTROABS 15.6* 6.4 7.3  HGB 17.2* 19.2* 19.3*  HCT 49.6 52.9* 52.5*  MCV 89.2 84.1 84.7  PLT 277 345 338   Basic Metabolic Panel: Recent Labs  Lab 12/19/21 0658 12/22/21 1017 12/22/21 1625  NA 140 132* 134*  K 3.7 2.8* 2.8*  CL 102 92* 90*  CO2 22 22 21*  GLUCOSE 160* 119* 105*  BUN 12 38* 45*  CREATININE 1.07 1.53* 1.17  CALCIUM 10.4* 10.1 10.7*  MG  --   --  2.5*   GFR: Estimated Creatinine Clearance: 99.7 mL/min (by C-G formula  based on SCr of 1.17 mg/dL). Liver Function Tests: Recent Labs  Lab 12/19/21 0658 12/22/21 1017 12/22/21 1625  AST 26 46* 38  ALT 24 60* 59*  ALKPHOS 134* 123 125  BILITOT 2.3* 5.6* 5.7*  PROT 9.0* 9.3* 9.0*  ALBUMIN 5.2* 5.2* 5.4*   Recent Labs  Lab 12/19/21 0658 12/22/21 1017 12/22/21 1625  LIPASE 28 29 17    No results for input(s): "AMMONIA" in the last 168 hours. Coagulation Profile: No results for input(s): "INR", "PROTIME" in the last 168 hours. Cardiac Enzymes: No results for input(s): "CKTOTAL", "CKMB", "CKMBINDEX", "TROPONINI", "TROPONINIHS" in the last 168 hours. BNP (last 3 results) No results for input(s): "PROBNP" in the last 8760 hours. HbA1C: No results for input(s): "HGBA1C" in the last 72 hours. CBG: Recent Labs  Lab 12/22/21 1727  GLUCAP 103*   Lipid Profile: No results for input(s): "CHOL", "HDL", "LDLCALC", "TRIG", "CHOLHDL", "LDLDIRECT" in the last 72 hours. Thyroid Function Tests: No results for input(s): "TSH", "T4TOTAL", "FREET4", "T3FREE", "THYROIDAB" in the last 72 hours. Anemia Panel: No results for input(s): "VITAMINB12", "FOLATE", "FERRITIN", "TIBC", "IRON", "RETICCTPCT" in the last 72 hours. Urine analysis:    Component Value Date/Time   COLORURINE YELLOW 12/22/2021 1600   APPEARANCEUR CLEAR 12/22/2021 1600   LABSPEC 1.035 (H) 12/22/2021 1600   PHURINE 6.0 12/22/2021 1600   GLUCOSEU  NEGATIVE 12/22/2021 1600   HGBUR TRACE (A) 12/22/2021 1600   BILIRUBINUR SMALL (A) 12/22/2021 1600   KETONESUR 40 (A) 12/22/2021 1600   PROTEINUR >300 (A) 12/22/2021 1600   NITRITE NEGATIVE 12/22/2021 1600   LEUKOCYTESUR NEGATIVE 12/22/2021 1600    Radiological Exams on Admission: I have personally reviewed images 12/24/2021 Abdomen Limited RUQ (LIVER/GB)  Result Date: 12/22/2021 CLINICAL DATA:  Epigastric pain EXAM: ULTRASOUND ABDOMEN LIMITED RIGHT UPPER QUADRANT COMPARISON:  Ultrasound 12/19/2021, CT 12/19/2021, ultrasound 05/12/2020 FINDINGS: Gallbladder: No gallstones or wall thickening visualized. No sonographic Murphy sign noted by sonographer. Common bile duct: Diameter: 3 mm Liver: Echogenicity within normal limits. Echogenic area in the left hepatic lobe measuring 2.7 x 1 x 1.5 cm. This is stable in appearance compared with ultrasound from February 2022. Portal vein is patent on color Doppler imaging with normal direction of blood flow towards the liver. Other: None. IMPRESSION: 1. Negative for gallstones 2. 2.7 cm echogenic area in the left hepatic lobe, could correspond to possible focal fat infiltration near falciform ligament versus hemangioma, this is felt stable to slightly decreased as compared to 05/12/2020. Electronically Signed   By: 05/14/2020 M.D.   On: 12/22/2021 16:40    EKG: My personal interpretation of EKG shows: sinus tachycardia, prolonged QTc    Assessment/Plan Principal Problem:   Abdominal pain Active Problems:   AKI (acute kidney injury) (HCC)   Marijuana abuse   Hypokalemia   Prolonged QT interval   Cholestasis    Assessment and Plan: * Abdominal pain Observation med tele bed. Continue with IVF. No opiates. protonix IV q12. Clear liquid diet. Checking UDS.  AKI (acute kidney injury) (HCC) Continue with IVF. Repeat BMP in AM.  Marijuana abuse Checking UDS. Marijuana use could be the reason for his intractable N/V(i.e. cannabis hyperemesis)  Prolonged  QT interval Magnesium level was 2.5.  Repeat BMP. Given more KCl if needed. Will need to use Tigan for N/V.  Hypokalemia Repeat CMP, magnesium.  Pt only had 20 meq IV KCl given in ER.  Cholestasis Unclear the cause of his persistently elevated bilirubin. Check hepatitis panel. It was negative in 04-2020.  May need referral to GI as outpatient.   DVT prophylaxis: SCDs Code Status: Full Code Family Communication: no family at bedside  Disposition Plan: return home  Consults called: none  Admission status: Observation, Med-Surg   Carollee Herter, DO [facility] 12/22/2021, 9:53 PM

## 2021-12-22 NOTE — ED Notes (Signed)
[facility] called and stated patient was there.

## 2021-12-22 NOTE — Assessment & Plan Note (Signed)
Continue with IVF. Repeat BMP in AM. 

## 2021-12-22 NOTE — Assessment & Plan Note (Signed)
Unclear the cause of his persistently elevated bilirubin. Check hepatitis panel. It was negative in 04-2020. May need referral to GI as outpatient.

## 2021-12-22 NOTE — Progress Notes (Signed)
26 year old male with past medical history of marijuana use presents to the emergency department due to several days of onset of abdominal pain, nausea and vomiting.  Abdominal pain was in the right upper quadrant and patient believes that it is related to his gallbladder.  Patient was seen in the ED on 9/26 due to similar presentation and he was treated with Zofran prior to being discharged home, his vomiting was thought to be related to cannabinoid hyperemesis syndrome. Patient returned to the ED today due to persistent nausea, vomiting and abdominal pain.

## 2021-12-23 ENCOUNTER — Encounter (HOSPITAL_COMMUNITY): Payer: Self-pay | Admitting: Internal Medicine

## 2021-12-23 LAB — COMPREHENSIVE METABOLIC PANEL
ALT: 55 U/L — ABNORMAL HIGH (ref 0–44)
AST: 34 U/L (ref 15–41)
Albumin: 4 g/dL (ref 3.5–5.0)
Alkaline Phosphatase: 103 U/L (ref 38–126)
Anion gap: 9 (ref 5–15)
BUN: 22 mg/dL — ABNORMAL HIGH (ref 6–20)
CO2: 27 mmol/L (ref 22–32)
Calcium: 8.9 mg/dL (ref 8.9–10.3)
Chloride: 100 mmol/L (ref 98–111)
Creatinine, Ser: 0.74 mg/dL (ref 0.61–1.24)
GFR, Estimated: >60 mL/min (ref >60)
Glucose, Bld: 91 mg/dL (ref 70–99)
Potassium: 3.5 mmol/L (ref 3.5–5.1)
Sodium: 136 mmol/L (ref 135–145)
Total Bilirubin: 4.7 mg/dL — ABNORMAL HIGH (ref 0.3–1.2)
Total Protein: 7 g/dL (ref 6.5–8.1)

## 2021-12-23 LAB — MAGNESIUM: Magnesium: 2.2 mg/dL (ref 1.7–2.4)

## 2021-12-23 LAB — BILIRUBIN, FRACTIONATED(TOT/DIR/INDIR)
Bilirubin, Direct: 0.5 mg/dL — ABNORMAL HIGH (ref 0.0–0.2)
Indirect Bilirubin: 4.2 mg/dL — ABNORMAL HIGH (ref 0.3–0.9)
Total Bilirubin: 4.7 mg/dL — ABNORMAL HIGH (ref 0.3–1.2)

## 2021-12-23 LAB — RAPID URINE DRUG SCREEN, HOSP PERFORMED
Amphetamines: NOT DETECTED
Barbiturates: NOT DETECTED
Benzodiazepines: NOT DETECTED
Cocaine: NOT DETECTED
Opiates: POSITIVE — AB
Tetrahydrocannabinol: POSITIVE — AB

## 2021-12-23 LAB — HIV ANTIBODY (ROUTINE TESTING W REFLEX): HIV Screen 4th Generation wRfx: NONREACTIVE

## 2021-12-23 LAB — GAMMA GT: GGT: 68 U/L — ABNORMAL HIGH (ref 7–50)

## 2021-12-23 MED ORDER — TRIMETHOBENZAMIDE HCL 100 MG/ML IM SOLN
200.0000 mg | Freq: Four times a day (QID) | INTRAMUSCULAR | Status: DC | PRN
  Administered 2021-12-23 – 2021-12-24 (×5): 200 mg via INTRAMUSCULAR
  Filled 2021-12-23 (×8): qty 2

## 2021-12-23 MED ORDER — ACETAMINOPHEN 650 MG RE SUPP: 650.0000 mg | Freq: Four times a day (QID) | RECTAL | Status: DC | PRN

## 2021-12-23 MED ORDER — BOOST / RESOURCE BREEZE PO LIQD CUSTOM
1.0000 | Freq: Three times a day (TID) | ORAL | Status: DC
  Administered 2021-12-23 – 2021-12-25 (×6): 1 via ORAL

## 2021-12-23 MED ORDER — POTASSIUM CHLORIDE IN NACL 40-0.9 MEQ/L-% IV SOLN
INTRAVENOUS | Status: AC
  Filled 2021-12-23 (×2): qty 1000

## 2021-12-23 MED ORDER — POTASSIUM CHLORIDE 20 MEQ PO PACK
20.0000 meq | PACK | Freq: Once | ORAL | Status: AC
  Administered 2021-12-23: 20 meq via ORAL
  Filled 2021-12-23: qty 1

## 2021-12-23 MED ORDER — SODIUM CHLORIDE 0.9 % IV SOLN: INTRAVENOUS | Status: DC

## 2021-12-23 MED ORDER — ACETAMINOPHEN 325 MG PO TABS: 650.0000 mg | ORAL_TABLET | Freq: Four times a day (QID) | ORAL | Status: DC | PRN

## 2021-12-23 MED ORDER — POTASSIUM CHLORIDE IN NACL 40-0.9 MEQ/L-% IV SOLN
INTRAVENOUS | Status: DC
  Filled 2021-12-23 (×3): qty 1000

## 2021-12-23 NOTE — Progress Notes (Signed)
PROGRESS NOTE    Tom Jones  TWS:[id number] DOB: 1995-05-11 DOA: 12/22/2021 PCP: Gildardo Pounds, NP    Brief Narrative:   Tom Jones is a 26 y.o. male with past medical history significant for marijuana abuse, history of cyclical vomiting syndrome with recurrent ED visits who presents to [facility] ED on 9/29 with continued nausea/vomiting and abdominal pain.  Was at [facility], ED earlier in the day but left before being seen.  Was also recently seen in [facility], ED on 9/26 for similar complaints and discharged home.  Patient reports he had initial nausea and vomiting related to eating spaghetti, stating he "overdid it" with the tomato sauce.  He woke up once again today with recurrence of symptoms, denies diarrhea or fevers.  Does endorse daily cannabinoid use.  In the ED, temperature 97.6 F, HR 100, RR 17, BP 132/107, SPO2 97% on room air.  WBC 10.7, hemoglobin 19.2, platelets 345.  Sodium 132, potassium 2.8, chloride 92, CO2 22, glucose 119, BUN 38, creatinine 1.53.  AST 46, ALT 60, total bilirubin 5.6.  Lipase 17.  COVID-19 PCR negative.  Influenza A/B PCR negative.  Urinalysis with negative leukocytes, negative nitrate, greater than 300 protein, rare bacteria 21-50 WBCs.  UDS positive for opiates, THC.  Ultrasound abdomen negative for gallstones, 2.7 cm echogenic area in the left hepatic lobe likely secondary to focal fat infiltration versus hemangioma, stable to slightly decreased compared to previous on 05/12/2020.  Recent CT scan abdomen/pelvis 9/26 with no acute intra-abdominal process.  EDP consulted [facility] for admission and further evaluation for persistent nausea/vomiting, hypokalemia, hyponatremia likely secondary to cannabinoid hyperemesis syndrome.  Assessment & Plan:   Intractable nausea/vomiting with abdominal pain Cannabinoid hyperemesis syndrome Patient presenting to the ED with recurrent nausea/vomiting and abdominal pain.  Multiple ED visits in the past for  similar occurrences.  Relates it to eating "spaghetti with tomato sauce".  But does endorse daily cannabinoid use.  Multiple work-ups with imaging studies unrevealing, no gallstones, no biliary ductal dilation or other intra-abdominal findings noted.  Suspect etiology likely secondary to cannabinoid hyperemesis syndrome.  Discussed complete cannabinoid cessation with patient and mother. --Clear liquid diet, will advance as tolerates --IV fluid hydration --Protonix 40 mg IV Q12h --Tigan 200mg  IM every 6 hours as needed nausea/vomiting  Acute renal failure: Resolved Creatinine 1.53 on admission, likely secondary to prerenal azotemia in the setting of dehydration from nausea/vomiting and poor oral intake. --Cr 1.53>>0.74 --Continue IV fluid hydration --Repeat BMP in a.m.  Hyponatremia Sodium 134 on admission, likely secondary to GI loss in the setting of nausea/vomiting. --Na 132>136 --Continue IV fluid hydration  Hypokalemia Potassium 2.9 on admission, repleted with repeat potassium level 3.5 this morning.  Magnesium 2.2.  Etiology likely secondary to GI loss given nausea/vomiting. --Continue repletion today --Repeat electrolytes in a.m. tickly magnesium  Prolonged QTc interval QTc 520 on EKG.  We will avoid QT prolonging medications  Cholestasis Patient with chronically elevated bilirubin.  Unclear etiology, no biliary ductal dilation or cholelithiasis noted on imaging.  Total bilirubin 4.7, direct bilirubin 0.5, indirect 4.2. --Check GGT -- Outpatient follow-up with gastroenterology, will place ambulatory referral on discharge   DVT prophylaxis: SCDs Start: 12/23/21 0200    Code Status: Full Code Family Communication: Updated mother via telephone this morning  Disposition Plan:  Level of care: Med-Surg Status is: Inpatient Remains inpatient appropriate because: Needs further advancement of diet before stable for discharge home    Consultants:  None  Procedures:  None  Antimicrobials:  None   Subjective: Patient seen examined bedside, resting comfortably.  Lying in bed.  Continues with mild abdominal discomfort, no further nausea/vomiting.  Tolerating clear liquid diet.  Discussed further advancement, would like to wait until this afternoon before deciding.  Updated mother via telephone.  No other specific questions or concerns at this time.  Denies headache, no dizziness, no chest pain, no palpitations, no shortness of breath, no fever/chills/night sweats, no diarrhea, no focal weakness, no cough/congestion, no paresthesias.  No acute events overnight per nursing staff.  Objective: Vitals:   12/23/21 0029 12/23/21 0500 12/23/21 0839 12/23/21 0945  BP: (!) 145/129 (!) 159/100 (!) 148/100 (!) 146/100  Pulse: 85 77 60 99  Resp: 18 20 16 18   Temp: 98.5 F (36.9 C) 98.6 F (37 C) 98.8 F (37.1 C) 99 F (37.2 C)  TempSrc: Oral Oral Oral Oral  SpO2: 100% 99% 100%     Intake/Output Summary (Last 24 hours) at 12/23/2021 1240 Last data filed at 12/23/2021 1000 Gross per 24 hour  Intake 1193.46 ml  Output 2 ml  Net 1191.46 ml   There were no vitals filed for this visit.  Examination:  Physical Exam: GEN: NAD, alert and oriented x 3, wd/wn HEENT: NCAT, PERRL, EOMI, sclera clear, MMM PULM: CTAB w/o wheezes/crackles, normal respiratory effort CV: RRR w/o M/G/R GI: abd soft, NTND, NABS, no R/G/M MSK: no peripheral edema, muscle strength globally intact 5/5 bilateral upper/lower extremities NEURO: CN II-XII intact, no focal deficits, sensation to light touch intact PSYCH: normal mood/affect Integumentary: dry/intact, no rashes or wounds    Data Reviewed: I have personally reviewed following labs and imaging studies  CBC: Recent Labs  Lab 12/19/21 0658 12/22/21 1017 12/22/21 1625  WBC 17.5* 10.7* 11.1*  NEUTROABS 15.6* 6.4 7.3  HGB 17.2* 19.2* 19.3*  HCT 49.6 52.9* 52.5*  MCV 89.2 84.1 84.7  PLT 277 345 312   Basic Metabolic  Panel: Recent Labs  Lab 12/19/21 0658 12/22/21 1017 12/22/21 1625 12/22/21 2133 12/23/21 0558  NA 140 132* 134* 135 136  K 3.7 2.8* 2.8* 2.9* 3.5  CL 102 92* 90* 97* 100  CO2 22 22 21* 28 27  GLUCOSE 160* 119* 105* 100* 91  BUN 12 38* 45* 31* 22*  CREATININE 1.07 1.53* 1.17 0.85 0.74  CALCIUM 10.4* 10.1 10.7* 8.7* 8.9  MG  --   --  2.5* 2.3 2.2   GFR: Estimated Creatinine Clearance: 145.7 mL/min (by C-G formula based on SCr of 0.74 mg/dL). Liver Function Tests: Recent Labs  Lab 12/19/21 0658 12/22/21 1017 12/22/21 1625 12/22/21 2133 12/23/21 0558 12/23/21 0737  AST 26 46* 38 37 34  --   ALT 24 60* 59* 53* 55*  --   ALKPHOS 134* 123 125 102 103  --   BILITOT 2.3* 5.6* 5.7* 4.7* 4.7* 4.7*  PROT 9.0* 9.3* 9.0* 7.2 7.0  --   ALBUMIN 5.2* 5.2* 5.4* 4.0 4.0  --    Recent Labs  Lab 12/19/21 0658 12/22/21 1017 12/22/21 1625  LIPASE 28 29 17    No results for input(s): "AMMONIA" in the last 168 hours. Coagulation Profile: No results for input(s): "INR", "PROTIME" in the last 168 hours. Cardiac Enzymes: No results for input(s): "CKTOTAL", "CKMB", "CKMBINDEX", "TROPONINI" in the last 168 hours. BNP (last 3 results) No results for input(s): "PROBNP" in the last 8760 hours. HbA1C: No results for input(s): "HGBA1C" in the last 72 hours. CBG: Recent Labs  Lab 12/22/21 1727  GLUCAP 103*   Lipid Profile: No results for input(s): "CHOL", "HDL", "LDLCALC", "TRIG", "CHOLHDL", "LDLDIRECT" in the last 72 hours. Thyroid Function Tests: No results for input(s): "TSH", "T4TOTAL", "FREET4", "T3FREE", "THYROIDAB" in the last 72 hours. Anemia Panel: No results for input(s): "VITAMINB12", "FOLATE", "FERRITIN", "TIBC", "IRON", "RETICCTPCT" in the last 72 hours. Sepsis Labs: Recent Labs  Lab 12/19/21 1027  LATICACIDVEN 2.0*    Recent Results (from the past 240 hour(s))  Resp Panel by RT-PCR (Flu A&B, Covid) Anterior Nasal Swab     Status: None   Collection Time: 12/19/21   8:34 AM   Specimen: Anterior Nasal Swab  Result Value Ref Range Status   SARS Coronavirus 2 by RT PCR NEGATIVE NEGATIVE Final    Comment: (NOTE) SARS-CoV-2 target nucleic acids are NOT DETECTED.  The SARS-CoV-2 RNA is generally detectable in upper respiratory specimens during the acute phase of infection. The lowest concentration of SARS-CoV-2 viral copies this assay can detect is 138 copies/mL. A negative result does not preclude SARS-Cov-2 infection and should not be used as the sole basis for treatment or other patient management decisions. A negative result may occur with  improper specimen collection/handling, submission of specimen other than nasopharyngeal swab, presence of viral mutation(s) within the areas targeted by this assay, and inadequate number of viral copies(<138 copies/mL). A negative result must be combined with clinical observations, patient history, and epidemiological information. The expected result is Negative.  Fact Sheet for Patients:  BloggerCourse.com  Fact Sheet for Healthcare Providers:  SeriousBroker.it  This test is no t yet approved or cleared by the [place] FDA and  has been authorized for detection and/or diagnosis of SARS-CoV-2 by FDA under an Emergency Use Authorization (EUA). This EUA will remain  in effect (meaning this test can be used) for the duration of the COVID-19 declaration under Section 564(b)(1) of the Act, 21 U.S.C.section 360bbb-3(b)(1), unless the authorization is terminated  or revoked sooner.       Influenza A by PCR NEGATIVE NEGATIVE Final   Influenza B by PCR NEGATIVE NEGATIVE Final    Comment: (NOTE) The Xpert Xpress SARS-CoV-2/FLU/RSV plus assay is intended as an aid in the diagnosis of influenza from Nasopharyngeal swab specimens and should not be used as a sole basis for treatment. Nasal washings and aspirates are unacceptable for Xpert Xpress  SARS-CoV-2/FLU/RSV testing.  Fact Sheet for Patients: BloggerCourse.com  Fact Sheet for Healthcare Providers: SeriousBroker.it  This test is not yet approved or cleared by the [place] FDA and has been authorized for detection and/or diagnosis of SARS-CoV-2 by FDA under an Emergency Use Authorization (EUA). This EUA will remain in effect (meaning this test can be used) for the duration of the COVID-19 declaration under Section 564(b)(1) of the Act, 21 U.S.C. section 360bbb-3(b)(1), unless the authorization is terminated or revoked.  Performed at [facility], [address] [place], [place] [address]   Resp Panel by RT-PCR (Flu A&B, Covid) Anterior Nasal Swab     Status: None   Collection Time: 12/22/21  5:09 PM   Specimen: Anterior Nasal Swab  Result Value Ref Range Status   SARS Coronavirus 2 by RT PCR NEGATIVE NEGATIVE Final    Comment: (NOTE) SARS-CoV-2 target nucleic acids are NOT DETECTED.  The SARS-CoV-2 RNA is generally detectable in upper respiratory specimens during the acute phase of infection. The lowest concentration of SARS-CoV-2 viral copies this assay can detect is 138 copies/mL. A negative result does not preclude SARS-Cov-2 infection and should  not be used as the sole basis for treatment or other patient management decisions. A negative result may occur with  improper specimen collection/handling, submission of specimen other than nasopharyngeal swab, presence of viral mutation(s) within the areas targeted by this assay, and inadequate number of viral copies(<138 copies/mL). A negative result must be combined with clinical observations, patient history, and epidemiological information. The expected result is Negative.  Fact Sheet for Patients:  BloggerCourse.com  Fact Sheet for Healthcare Providers:  SeriousBroker.it  This test is no t yet  approved or cleared by the [place] FDA and  has been authorized for detection and/or diagnosis of SARS-CoV-2 by FDA under an Emergency Use Authorization (EUA). This EUA will remain  in effect (meaning this test can be used) for the duration of the COVID-19 declaration under Section 564(b)(1) of the Act, 21 U.S.C.section 360bbb-3(b)(1), unless the authorization is terminated  or revoked sooner.       Influenza A by PCR NEGATIVE NEGATIVE Final   Influenza B by PCR NEGATIVE NEGATIVE Final    Comment: (NOTE) The Xpert Xpress SARS-CoV-2/FLU/RSV plus assay is intended as an aid in the diagnosis of influenza from Nasopharyngeal swab specimens and should not be used as a sole basis for treatment. Nasal washings and aspirates are unacceptable for Xpert Xpress SARS-CoV-2/FLU/RSV testing.  Fact Sheet for Patients: BloggerCourse.com  Fact Sheet for Healthcare Providers: SeriousBroker.it  This test is not yet approved or cleared by the [place] FDA and has been authorized for detection and/or diagnosis of SARS-CoV-2 by FDA under an Emergency Use Authorization (EUA). This EUA will remain in effect (meaning this test can be used) for the duration of the COVID-19 declaration under Section 564(b)(1) of the Act, 21 U.S.C. section 360bbb-3(b)(1), unless the authorization is terminated or revoked.  Performed at [facility], [address], [place], [place] [address]          Radiology Studies: US Abdomen Limited RUQ (LIVER/GB)  Result Date: 12/22/2021 CLINICAL DATA:  Epigastric pain EXAM: ULTRASOUND ABDOMEN LIMITED RIGHT UPPER QUADRANT COMPARISON:  Ultrasound 12/19/2021, CT 12/19/2021, ultrasound 05/12/2020 FINDINGS: Gallbladder: No gallstones or wall thickening visualized. No sonographic Murphy sign noted by sonographer. Common bile duct: Diameter: 3 mm Liver: Echogenicity within normal limits. Echogenic area  in the left hepatic lobe measuring 2.7 x 1 x 1.5 cm. This is stable in appearance compared with ultrasound from February 2022. Portal vein is patent on color Doppler imaging with normal direction of blood flow towards the liver. Other: None. IMPRESSION: 1. Negative for gallstones 2. 2.7 cm echogenic area in the left hepatic lobe, could correspond to possible focal fat infiltration near falciform ligament versus hemangioma, this is felt stable to slightly decreased as compared to 05/12/2020. Electronically Signed   By: Jasmine Pang M.D.   On: 12/22/2021 16:40        Scheduled Meds:  feeding supplement  1 Container Oral TID BM   Continuous Infusions:  0.9 % NaCl with KCl 40 mEq / L 100 mL/hr at 12/23/21 1000     LOS: 1 day    Time spent: 50 minutes spent on chart review, discussion with nursing staff, consultants, updating family and interview/physical exam; more than 50% of that time was spent in counseling and/or coordination of care.    Alvira Philips [place], DO [facility] Available via Epic secure chat 7am-7pm After these hours, please refer to coverage provider listed on [email or web address] 12/23/2021, 12:40 PM

## 2021-12-24 LAB — HEPATIC FUNCTION PANEL
ALT: 55 U/L — ABNORMAL HIGH (ref 0–44)
AST: 46 U/L — ABNORMAL HIGH (ref 15–41)
Albumin: 4 g/dL (ref 3.5–5.0)
Alkaline Phosphatase: 94 U/L (ref 38–126)
Bilirubin, Direct: 0.5 mg/dL — ABNORMAL HIGH (ref 0.0–0.2)
Indirect Bilirubin: 3.3 mg/dL — ABNORMAL HIGH (ref 0.3–0.9)
Total Bilirubin: 3.8 mg/dL — ABNORMAL HIGH (ref 0.3–1.2)
Total Protein: 6.9 g/dL (ref 6.5–8.1)

## 2021-12-24 LAB — HEPATITIS PANEL, ACUTE
HCV Ab: NONREACTIVE
Hep A IgM: NONREACTIVE
Hep B C IgM: NONREACTIVE
Hepatitis B Surface Ag: NONREACTIVE

## 2021-12-24 LAB — CBC
HCT: 46.8 % (ref 39.0–52.0)
Hemoglobin: 15.3 g/dL (ref 13.0–17.0)
MCH: 31 pg (ref 26.0–34.0)
MCHC: 32.7 g/dL (ref 30.0–36.0)
MCV: 94.7 fL (ref 80.0–100.0)
Platelets: 201 10*3/uL (ref 150–400)
RBC: 4.94 MIL/uL (ref 4.22–5.81)
RDW: 11.4 % — ABNORMAL LOW (ref 11.5–15.5)
WBC: 7.3 10*3/uL (ref 4.0–10.5)
nRBC: 0 % (ref 0.0–0.2)

## 2021-12-24 LAB — BASIC METABOLIC PANEL
Anion gap: 8 (ref 5–15)
BUN: 16 mg/dL (ref 6–20)
CO2: 26 mmol/L (ref 22–32)
Calcium: 8.8 mg/dL — ABNORMAL LOW (ref 8.9–10.3)
Chloride: 102 mmol/L (ref 98–111)
Creatinine, Ser: 0.81 mg/dL (ref 0.61–1.24)
GFR, Estimated: >60 mL/min (ref >60)
Glucose, Bld: 89 mg/dL (ref 70–99)
Potassium: 3.9 mmol/L (ref 3.5–5.1)
Sodium: 136 mmol/L (ref 135–145)

## 2021-12-24 LAB — MAGNESIUM: Magnesium: 2.2 mg/dL (ref 1.7–2.4)

## 2021-12-24 MED ORDER — MELATONIN 5 MG PO TABS
5.0000 mg | ORAL_TABLET | Freq: Once | ORAL | Status: AC
  Administered 2021-12-24: 5 mg via ORAL
  Filled 2021-12-24: qty 1

## 2021-12-24 NOTE — Progress Notes (Signed)
PROGRESS NOTE    Tom Jones  [id number] DOB: October 14, 1995 DOA: 12/22/2021 PCP: Gildardo Pounds, NP    Brief Narrative:   Tom Jones is a 26 y.o. male with past medical history significant for marijuana abuse, history of cyclical vomiting syndrome with recurrent ED visits who presents to [facility] ED on 9/29 with continued nausea/vomiting and abdominal pain.  Was at [facility], ED earlier in the day but left before being seen.  Was also recently seen in [facility], ED on 9/26 for similar complaints and discharged home.  Patient reports he had initial nausea and vomiting related to eating spaghetti, stating he "overdid it" with the tomato sauce.  He woke up once again today with recurrence of symptoms, denies diarrhea or fevers.  Does endorse daily cannabinoid use.  In the ED, temperature 97.6 F, HR 100, RR 17, BP 132/107, SPO2 97% on room air.  WBC 10.7, hemoglobin 19.2, platelets 345.  Sodium 132, potassium 2.8, chloride 92, CO2 22, glucose 119, BUN 38, creatinine 1.53.  AST 46, ALT 60, total bilirubin 5.6.  Lipase 17.  COVID-19 PCR negative.  Influenza A/B PCR negative.  Urinalysis with negative leukocytes, negative nitrate, greater than 300 protein, rare bacteria 21-50 WBCs.  UDS positive for opiates, THC.  Ultrasound abdomen negative for gallstones, 2.7 cm echogenic area in the left hepatic lobe likely secondary to focal fat infiltration versus hemangioma, stable to slightly decreased compared to previous on 05/12/2020.  Recent CT scan abdomen/pelvis 9/26 with no acute intra-abdominal process.  EDP consulted [facility] for admission and further evaluation for persistent nausea/vomiting, hypokalemia, hyponatremia likely secondary to cannabinoid hyperemesis syndrome.  Assessment & Plan:   Intractable nausea/vomiting with abdominal pain Cannabinoid hyperemesis syndrome Patient presenting to the ED with recurrent nausea/vomiting and abdominal pain.  Multiple ED visits in the past for  similar occurrences.  Relates it to eating "spaghetti with tomato sauce".  But does endorse daily cannabinoid use.  Multiple work-ups with imaging studies unrevealing, no gallstones, no biliary ductal dilation or other intra-abdominal findings noted.  Suspect etiology likely secondary to cannabinoid hyperemesis syndrome.  Discussed complete cannabinoid cessation with patient and mother. --Advance to full liquid diet, further advancement as tolerates --IV fluid hydration --Protonix 40 mg IV Q12h --Tigan 200mg  IM every 6 hours as needed nausea/vomiting  Acute renal failure: Resolved Creatinine 1.53 on admission, likely secondary to prerenal azotemia in the setting of dehydration from nausea/vomiting and poor oral intake. --Cr 1.53>>0.81 --Continue IV fluid hydration  Hyponatremia: Resolved Sodium 134 on admission, likely secondary to GI loss in the setting of nausea/vomiting. --Na 132>136 --Continue IV fluid hydration  Hypokalemia Potassium 2.9 on admission, repleted with repeat potassium level 3.9 this morning.  Magnesium 2.2.  Etiology likely secondary to GI loss given nausea/vomiting. --Repeat electrolytes in a.m.   Prolonged QTc interval QTc 520 on EKG.  We will avoid QT prolonging medications  Cholestasis Patient with chronically elevated bilirubin.  Unclear etiology, no biliary ductal dilation or cholelithiasis noted on imaging.  Total bilirubin 4.7, direct bilirubin 0.5, indirect 4.2. GGT elevated at 68.  Consideration for drug-induced liver injury with marijuana. --Outpatient follow-up with gastroenterology, will place ambulatory referral on discharge   DVT prophylaxis: SCDs Start: 12/23/21 0200    Code Status: Full Code Family Communication: No family present at bedside this morning.  Disposition Plan:  Level of care: Med-Surg Status is: Inpatient Remains inpatient appropriate because: Needs further advancement of diet before stable for discharge home    Consultants:  None  Procedures:  None  Antimicrobials:  None   Subjective: Patient seen examined bedside, resting comfortably.  Lying in bed.  Patient states "Doc I need help".  Continues with nausea, 1 episode of emesis.  Patient requesting advancement of diet to full liquids, believes "sugar" in the clear liquid diet likely causing him to feel nauseous.  No family present.  Long discussion regarding his underlying marijuana abuse as a relates to his nausea/vomiting, and likely liver injury.  Patient ask if there is any "reversal agent" for marijuana.  Discussed with him just needs to stop all use and over time will have improvement of symptoms.  No other questions or concerns at this time. Denies headache, no dizziness, no chest pain, no palpitations, no shortness of breath, no fever/chills/night sweats, no diarrhea, no focal weakness, no cough/congestion, no paresthesias.  No acute events overnight per nursing staff.  Objective: Vitals:   12/23/21 0945 12/23/21 1335 12/23/21 2012 12/24/21 0430  BP: (!) 146/100 (!) 143/99 (!) 148/96 (!) 120/51  Pulse: 99 64 66 67  Resp: 18 18 18 16   Temp: 99 F (37.2 C) 98.8 F (37.1 C) 99.2 F (37.3 C) 97.6 F (36.4 C)  TempSrc: Oral Oral Oral   SpO2:  100% 100% 99%    Intake/Output Summary (Last 24 hours) at 12/24/2021 1324 Last data filed at 12/23/2021 1500 Gross per 24 hour  Intake 903.89 ml  Output --  Net 903.89 ml   There were no vitals filed for this visit.  Examination:  Physical Exam: GEN: NAD, alert and oriented x 3, wd/wn HEENT: NCAT, PERRL, EOMI, sclera clear, MMM PULM: CTAB w/o wheezes/crackles, normal respiratory effort, on room air CV: RRR w/o M/G/R GI: abd soft, NTND, NABS, no R/G/M MSK: no peripheral edema, muscle strength globally intact 5/5 bilateral upper/lower extremities NEURO: CN II-XII intact, no focal deficits, sensation to light touch intact PSYCH: normal mood/affect Integumentary: dry/intact, no rashes or  wounds    Data Reviewed: I have personally reviewed following labs and imaging studies  CBC: Recent Labs  Lab 12/19/21 0658 12/22/21 1017 12/22/21 1625 12/24/21 0815  WBC 17.5* 10.7* 11.1* 7.3  NEUTROABS 15.6* 6.4 7.3  --   HGB 17.2* 19.2* 19.3* 15.3  HCT 49.6 52.9* 52.5* 46.8  MCV 89.2 84.1 84.7 94.7  PLT 277 345 312 546   Basic Metabolic Panel: Recent Labs  Lab 12/22/21 1017 12/22/21 1625 12/22/21 2133 12/23/21 0558 12/24/21 0815  NA 132* 134* 135 136 136  K 2.8* 2.8* 2.9* 3.5 3.9  CL 92* 90* 97* 100 102  CO2 22 21* 28 27 26   GLUCOSE 119* 105* 100* 91 89  BUN 38* 45* 31* 22* 16  CREATININE 1.53* 1.17 0.85 0.74 0.81  CALCIUM 10.1 10.7* 8.7* 8.9 8.8*  MG  --  2.5* 2.3 2.2 2.2   GFR: Estimated Creatinine Clearance: 143.9 mL/min (by C-G formula based on SCr of 0.81 mg/dL). Liver Function Tests: Recent Labs  Lab 12/22/21 1017 12/22/21 1625 12/22/21 2133 12/23/21 0558 12/23/21 0737 12/24/21 0815  AST 46* 38 37 34  --  46*  ALT 60* 59* 53* 55*  --  55*  ALKPHOS 123 125 102 103  --  94  BILITOT 5.6* 5.7* 4.7* 4.7* 4.7* 3.8*  PROT 9.3* 9.0* 7.2 7.0  --  6.9  ALBUMIN 5.2* 5.4* 4.0 4.0  --  4.0   Recent Labs  Lab 12/19/21 0658 12/22/21 1017 12/22/21 1625  LIPASE 28 29 17    No results for  input(s): "AMMONIA" in the last 168 hours. Coagulation Profile: No results for input(s): "INR", "PROTIME" in the last 168 hours. Cardiac Enzymes: No results for input(s): "CKTOTAL", "CKMB", "CKMBINDEX", "TROPONINI" in the last 168 hours. BNP (last 3 results) No results for input(s): "PROBNP" in the last 8760 hours. HbA1C: No results for input(s): "HGBA1C" in the last 72 hours. CBG: Recent Labs  Lab 12/22/21 1727  GLUCAP 103*   Lipid Profile: No results for input(s): "CHOL", "HDL", "LDLCALC", "TRIG", "CHOLHDL", "LDLDIRECT" in the last 72 hours. Thyroid Function Tests: No results for input(s): "TSH", "T4TOTAL", "FREET4", "T3FREE", "THYROIDAB" in the last 72  hours. Anemia Panel: No results for input(s): "VITAMINB12", "FOLATE", "FERRITIN", "TIBC", "IRON", "RETICCTPCT" in the last 72 hours. Sepsis Labs: Recent Labs  Lab 12/19/21 1027  LATICACIDVEN 2.0*    Recent Results (from the past 240 hour(s))  Resp Panel by RT-PCR (Flu A&B, Covid) Anterior Nasal Swab     Status: None   Collection Time: 12/19/21  8:34 AM   Specimen: Anterior Nasal Swab  Result Value Ref Range Status   SARS Coronavirus 2 by RT PCR NEGATIVE NEGATIVE Final    Comment: (NOTE) SARS-CoV-2 target nucleic acids are NOT DETECTED.  The SARS-CoV-2 RNA is generally detectable in upper respiratory specimens during the acute phase of infection. The lowest concentration of SARS-CoV-2 viral copies this assay can detect is 138 copies/mL. A negative result does not preclude SARS-Cov-2 infection and should not be used as the sole basis for treatment or other patient management decisions. A negative result may occur with  improper specimen collection/handling, submission of specimen other than nasopharyngeal swab, presence of viral mutation(s) within the areas targeted by this assay, and inadequate number of viral copies(<138 copies/mL). A negative result must be combined with clinical observations, patient history, and epidemiological information. The expected result is Negative.  Fact Sheet for Patients:  EntrepreneurPulse.com.au  Fact Sheet for Healthcare Providers:  IncredibleEmployment.be  This test is no t yet approved or cleared by the [place] FDA and  has been authorized for detection and/or diagnosis of SARS-CoV-2 by FDA under an Emergency Use Authorization (EUA). This EUA will remain  in effect (meaning this test can be used) for the duration of the COVID-19 declaration under Section 564(b)(1) of the Act, 21 U.S.C.section 360bbb-3(b)(1), unless the authorization is terminated  or revoked sooner.       Influenza A by PCR  NEGATIVE NEGATIVE Final   Influenza B by PCR NEGATIVE NEGATIVE Final    Comment: (NOTE) The Xpert Xpress SARS-CoV-2/FLU/RSV plus assay is intended as an aid in the diagnosis of influenza from Nasopharyngeal swab specimens and should not be used as a sole basis for treatment. Nasal washings and aspirates are unacceptable for Xpert Xpress SARS-CoV-2/FLU/RSV testing.  Fact Sheet for Patients: EntrepreneurPulse.com.au  Fact Sheet for Healthcare Providers: IncredibleEmployment.be  This test is not yet approved or cleared by the [place] FDA and has been authorized for detection and/or diagnosis of SARS-CoV-2 by FDA under an Emergency Use Authorization (EUA). This EUA will remain in effect (meaning this test can be used) for the duration of the COVID-19 declaration under Section 564(b)(1) of the Act, 21 U.S.C. section 360bbb-3(b)(1), unless the authorization is terminated or revoked.  Performed at [facility], [place] [address], [place], [place] [address]   Resp Panel by RT-PCR (Flu A&B, Covid) Anterior Nasal Swab     Status: None   Collection Time: 12/22/21  5:09 PM   Specimen: Anterior Nasal Swab  Result  Value Ref Range Status   SARS Coronavirus 2 by RT PCR NEGATIVE NEGATIVE Final    Comment: (NOTE) SARS-CoV-2 target nucleic acids are NOT DETECTED.  The SARS-CoV-2 RNA is generally detectable in upper respiratory specimens during the acute phase of infection. The lowest concentration of SARS-CoV-2 viral copies this assay can detect is 138 copies/mL. A negative result does not preclude SARS-Cov-2 infection and should not be used as the sole basis for treatment or other patient management decisions. A negative result may occur with  improper specimen collection/handling, submission of specimen other than nasopharyngeal swab, presence of viral mutation(s) within the areas targeted by this assay, and inadequate number of viral copies(<138  copies/mL). A negative result must be combined with clinical observations, patient history, and epidemiological information. The expected result is Negative.  Fact Sheet for Patients:  EntrepreneurPulse.com.au  Fact Sheet for Healthcare Providers:  IncredibleEmployment.be  This test is no t yet approved or cleared by the [place] FDA and  has been authorized for detection and/or diagnosis of SARS-CoV-2 by FDA under an Emergency Use Authorization (EUA). This EUA will remain  in effect (meaning this test can be used) for the duration of the COVID-19 declaration under Section 564(b)(1) of the Act, 21 U.S.C.section 360bbb-3(b)(1), unless the authorization is terminated  or revoked sooner.       Influenza A by PCR NEGATIVE NEGATIVE Final   Influenza B by PCR NEGATIVE NEGATIVE Final    Comment: (NOTE) The Xpert Xpress SARS-CoV-2/FLU/RSV plus assay is intended as an aid in the diagnosis of influenza from Nasopharyngeal swab specimens and should not be used as a sole basis for treatment. Nasal washings and aspirates are unacceptable for Xpert Xpress SARS-CoV-2/FLU/RSV testing.  Fact Sheet for Patients: EntrepreneurPulse.com.au  Fact Sheet for Healthcare Providers: IncredibleEmployment.be  This test is not yet approved or cleared by the [place] FDA and has been authorized for detection and/or diagnosis of SARS-CoV-2 by FDA under an Emergency Use Authorization (EUA). This EUA will remain in effect (meaning this test can be used) for the duration of the COVID-19 declaration under Section 564(b)(1) of the Act, 21 U.S.C. section 360bbb-3(b)(1), unless the authorization is terminated or revoked.  Performed at [facility], [address] [place], [place] [address]          Radiology Studies: US Abdomen Limited RUQ (LIVER/GB)  Result Date: 12/22/2021 CLINICAL DATA:  Epigastric  pain EXAM: ULTRASOUND ABDOMEN LIMITED RIGHT UPPER QUADRANT COMPARISON:  Ultrasound 12/19/2021, CT 12/19/2021, ultrasound 05/12/2020 FINDINGS: Gallbladder: No gallstones or wall thickening visualized. No sonographic Murphy sign noted by sonographer. Common bile duct: Diameter: 3 mm Liver: Echogenicity within normal limits. Echogenic area in the left hepatic lobe measuring 2.7 x 1 x 1.5 cm. This is stable in appearance compared with ultrasound from February 2022. Portal vein is patent on color Doppler imaging with normal direction of blood flow towards the liver. Other: None. IMPRESSION: 1. Negative for gallstones 2. 2.7 cm echogenic area in the left hepatic lobe, could correspond to possible focal fat infiltration near falciform ligament versus hemangioma, this is felt stable to slightly decreased as compared to 05/12/2020. Electronically Signed   By: Donavan Foil M.D.   On: 12/22/2021 16:40        Scheduled Meds:  feeding supplement  1 Container Oral TID BM   Continuous Infusions:  0.9 % NaCl with KCl 40 mEq / L 100 mL/hr at 12/23/21 1610     LOS: 2 days    Time spent: 50  minutes spent on chart review, discussion with nursing staff, consultants, updating family and interview/physical exam; more than 50% of that time was spent in counseling and/or coordination of care.    Adoria Kawamoto J [place] DO [facility] Available via Epic secure chat 7am-7pm After these hours, please refer to coverage provider listed on [email or web address] 12/24/2021, 1:24 PM

## 2021-12-25 LAB — BASIC METABOLIC PANEL
Anion gap: 6 (ref 5–15)
BUN: 14 mg/dL (ref 6–20)
CO2: 24 mmol/L (ref 22–32)
Calcium: 8.8 mg/dL — ABNORMAL LOW (ref 8.9–10.3)
Chloride: 104 mmol/L (ref 98–111)
Creatinine, Ser: 0.76 mg/dL (ref 0.61–1.24)
GFR, Estimated: >60 mL/min (ref >60)
Glucose, Bld: 88 mg/dL (ref 70–99)
Potassium: 4.4 mmol/L (ref 3.5–5.1)
Sodium: 134 mmol/L — ABNORMAL LOW (ref 135–145)

## 2021-12-25 LAB — HEPATIC FUNCTION PANEL
ALT: 49 U/L — ABNORMAL HIGH (ref 0–44)
AST: 48 U/L — ABNORMAL HIGH (ref 15–41)
Albumin: 4 g/dL (ref 3.5–5.0)
Alkaline Phosphatase: 99 U/L (ref 38–126)
Bilirubin, Direct: 0.4 mg/dL — ABNORMAL HIGH (ref 0.0–0.2)
Indirect Bilirubin: 2.4 mg/dL — ABNORMAL HIGH (ref 0.3–0.9)
Total Bilirubin: 2.8 mg/dL — ABNORMAL HIGH (ref 0.3–1.2)
Total Protein: 7.2 g/dL (ref 6.5–8.1)

## 2021-12-25 LAB — CBC
HCT: 43.9 % (ref 39.0–52.0)
Hemoglobin: 14.9 g/dL (ref 13.0–17.0)
MCH: 30.6 pg (ref 26.0–34.0)
MCHC: 33.9 g/dL (ref 30.0–36.0)
MCV: 90.1 fL (ref 80.0–100.0)
Platelets: 239 10*3/uL (ref 150–400)
RBC: 4.87 MIL/uL (ref 4.22–5.81)
RDW: 11.2 % — ABNORMAL LOW (ref 11.5–15.5)
WBC: 7.7 10*3/uL (ref 4.0–10.5)
nRBC: 0 % (ref 0.0–0.2)

## 2021-12-25 LAB — MAGNESIUM: Magnesium: 2 mg/dL (ref 1.7–2.4)

## 2021-12-25 MED ORDER — ONDANSETRON HCL 4 MG PO TABS: 4.0000 mg | ORAL_TABLET | Freq: Four times a day (QID) | ORAL | 0 refills | Status: DC | PRN

## 2021-12-25 MED ORDER — ONDANSETRON HCL 4 MG/2ML IJ SOLN: 4.0000 mg | Freq: Four times a day (QID) | INTRAMUSCULAR | Status: DC | PRN

## 2021-12-25 NOTE — Plan of Care (Addendum)
Patient is stable, AVS reviewed with patient, he acknowledged understanding. IV line removed

## 2021-12-25 NOTE — Plan of Care (Signed)
Patient is alert, oriented, and in no apparent distress. He denies nausea at this time

## 2021-12-25 NOTE — Progress Notes (Signed)
  Transition of Care (TOC) Screening Note   Patient Details  Name: CHEZ BULNES Date of Birth: 01-Nov-1995   Transition of Care [facility]) CM/SW Contact:    Vassie Moselle, LCSW Phone Number: 12/25/2021, 12:09 PM    Transition of Care Department [facility]) has reviewed patient and no TOC needs have been identified at this time. We will continue to monitor patient advancement through interdisciplinary progression rounds. If new patient transition needs arise, please place a TOC consult.

## 2021-12-25 NOTE — Discharge Summary (Signed)
Physician Discharge Summary  SEARS ORAN ZOX:[id number] DOB: Jul 04, 1995 DOA: 12/22/2021  PCP: Claiborne Rigg, NP  Admit date: 12/22/2021 Discharge date: 12/25/2021  Admitted From: Home Disposition: Home  Recommendations for Outpatient Follow-up:  Follow up with PCP in 1-2 weeks Ambulatory referral placed to gastroenterology for elevated LFTs, total bilirubin likely secondary to drug-induced liver injury from marijuana abuse. Zofran as needed for nausea/vomiting Continue to encourage complete cessation from marijuana Please obtain CMP 1 week to reassess LFTs, total bilirubin  Home Health: No Equipment/Devices: None  Discharge Condition: Stable  CODE STATUS: Full code Diet recommendation: Regular diet  History of present illness:  Tom Jones is a 26 y.o. male with past medical history significant for marijuana abuse, history of cyclical vomiting syndrome with recurrent ED visits who presents to [facility] ED on 9/29 with continued nausea/vomiting and abdominal pain.  Was at [facility], ED earlier in the day but left before being seen.  Was also recently seen in [facility], ED on 9/26 for similar complaints and discharged home.  Patient reports he had initial nausea and vomiting related to eating spaghetti, stating he "overdid it" with the tomato sauce.  He woke up once again today with recurrence of symptoms, denies diarrhea or fevers.  Does endorse daily cannabinoid use.   In the ED, temperature 97.6 F, HR 100, RR 17, BP 132/107, SPO2 97% on room air.  WBC 10.7, hemoglobin 19.2, platelets 345.  Sodium 132, potassium 2.8, chloride 92, CO2 22, glucose 119, BUN 38, creatinine 1.53.  AST 46, ALT 60, total bilirubin 5.6.  Lipase 17.  COVID-19 PCR negative.  Influenza A/B PCR negative.  Urinalysis with negative leukocytes, negative nitrate, greater than 300 protein, rare bacteria 21-50 WBCs.  UDS positive for opiates, THC.  Ultrasound abdomen negative for gallstones, 2.7 cm  echogenic area in the left hepatic lobe likely secondary to focal fat infiltration versus hemangioma, stable to slightly decreased compared to previous on 05/12/2020.  Recent CT scan abdomen/pelvis 9/26 with no acute intra-abdominal process.  EDP consulted [facility] for admission and further evaluation for persistent nausea/vomiting, hypokalemia, hyponatremia likely secondary to cannabinoid hyperemesis syndrome.  Hospital course:  Intractable nausea/vomiting with abdominal pain Cannabinoid hyperemesis syndrome Patient presenting to the ED with recurrent nausea/vomiting and abdominal pain.  Multiple ED visits in the past for similar occurrences.  Relates it to eating "spaghetti with tomato sauce".  But does endorse daily cannabinoid use.  Multiple work-ups with imaging studies unrevealing, no gallstones, no biliary ductal dilation or other intra-abdominal findings noted.  Suspect etiology likely secondary to cannabinoid hyperemesis syndrome.  Discussed complete cannabinoid cessation with patient and mother.  Patient is diet was slowly advanced with toleration.  Continue to encourage complete cessation from marijuana.  Outpatient follow-up with PCP.   Acute renal failure: Resolved Creatinine 1.53 on admission, likely secondary to prerenal azotemia in the setting of dehydration from nausea/vomiting and poor oral intake.  Creatinine proved to 0.81 at time of discharge following aggressive IV fluid hydration.  Hyponatremia: Resolved Sodium 134 on admission, likely secondary to GI loss in the setting of nausea/vomiting.  Sodium up to 136 at time of discharge.  Repeat CMP 1 week.   Hypokalemia Potassium 2.9 on admission, repleted with repeat potassium level 3.9 this morning.  Magnesium 2.2.  Etiology likely secondary to GI loss given nausea/vomiting.  Potassium now normalized, 4.4 at time of discharge.   Prolonged QTc interval: Resolved QTc 520 on EKG on admission. repeat EKG today with QTc  421, now resolved.    Cholestasis Patient with chronically elevated bilirubin.  Unclear etiology, no biliary ductal dilation or cholelithiasis noted on imaging.  Total bilirubin 4.7, direct bilirubin 0.5, indirect 4.2. GGT elevated at 68.  Consideration for drug-induced liver injury with marijuana.  Total bilirubin peaked during hospitalization of 5.7, now down to 2.8 at time of discharge.  Ambulatory referral placed to gastroenterology for further evaluation, suspect likely secondary to drug-induced liver injury from chronic marijuana abuse.   Discharge Diagnoses:  Principal Problem:   Abdominal pain Active Problems:   AKI (acute kidney injury) (HCC)   Marijuana abuse   Hypokalemia   Prolonged QT interval   Cholestasis    Discharge Instructions  Discharge Instructions     Ambulatory referral to Gastroenterology   Complete by: As directed    What is the reason for referral?: Other   Call MD for:  difficulty breathing, headache or visual disturbances   Complete by: As directed    Call MD for:  extreme fatigue   Complete by: As directed    Call MD for:  persistant dizziness or light-headedness   Complete by: As directed    Call MD for:  persistant nausea and vomiting   Complete by: As directed    Call MD for:  severe uncontrolled pain   Complete by: As directed    Call MD for:  temperature >100.4   Complete by: As directed    Diet - low sodium heart healthy   Complete by: As directed    Increase activity slowly   Complete by: As directed       Allergies as of 12/25/2021   No Known Allergies      Medication List     STOP taking these medications    albuterol 108 (90 Base) MCG/ACT inhaler Commonly known as: VENTOLIN HFA   alum & mag hydroxide-simeth 200-200-20 MG/5ML suspension Commonly known as: MAALOX/MYLANTA   dicyclomine 20 MG tablet Commonly known as: BENTYL   prochlorperazine 10 MG tablet Commonly known as: COMPAZINE   traMADol 50 MG tablet Commonly known as: Ultram        TAKE these medications    ondansetron 4 MG tablet Commonly known as: ZOFRAN Take 1 tablet (4 mg total) by mouth every 6 (six) hours as needed for nausea.   pantoprazole 40 MG tablet Commonly known as: Protonix Take 1 tablet (40 mg total) by mouth daily.        No Known Allergies  Consultations: None   Procedures/Studies: US Abdomen Limited RUQ (LIVER/GB)  Result Date: 12/22/2021 CLINICAL DATA:  Epigastric pain EXAM: ULTRASOUND ABDOMEN LIMITED RIGHT UPPER QUADRANT COMPARISON:  Ultrasound 12/19/2021, CT 12/19/2021, ultrasound 05/12/2020 FINDINGS: Gallbladder: No gallstones or wall thickening visualized. No sonographic Murphy sign noted by sonographer. Common bile duct: Diameter: 3 mm Liver: Echogenicity within normal limits. Echogenic area in the left hepatic lobe measuring 2.7 x 1 x 1.5 cm. This is stable in appearance compared with ultrasound from February 2022. Portal vein is patent on color Doppler imaging with normal direction of blood flow towards the liver. Other: None. IMPRESSION: 1. Negative for gallstones 2. 2.7 cm echogenic area in the left hepatic lobe, could correspond to possible focal fat infiltration near falciform ligament versus hemangioma, this is felt stable to slightly decreased as compared to 05/12/2020. Electronically Signed   By: Jasmine Pang M.D.   On: 12/22/2021 16:40   CT ABDOMEN PELVIS W CONTRAST  Result Date: 12/19/2021 CLINICAL DATA:  Epigastric pain. EXAM:  CT ABDOMEN AND PELVIS WITH CONTRAST TECHNIQUE: Multidetector CT imaging of the abdomen and pelvis was performed using the standard protocol following bolus administration of intravenous contrast. RADIATION DOSE REDUCTION: This exam was performed according to the departmental dose-optimization program which includes automated exposure control, adjustment of the mA and/or kV according to patient size and/or use of iterative reconstruction technique. CONTRAST:  75mL OMNIPAQUE IOHEXOL 350 MG/ML SOLN  COMPARISON:  Ultrasound of the abdomen December 19, 2021 FINDINGS: Lower chest: No acute abnormality. Hepatobiliary: No focal liver abnormality is seen. No gallstones, gallbladder wall thickening, or biliary dilatation. Pancreas: Unremarkable. No pancreatic ductal dilatation or surrounding inflammatory changes. Spleen: Normal in size without focal abnormality. Adrenals/Urinary Tract: Adrenal glands are unremarkable. Kidneys are normal, without renal calculi, focal lesion, or hydronephrosis. Bladder is unremarkable. Stomach/Bowel: Stomach is within normal limits. Appendix appears normal. No evidence of bowel wall thickening, distention, or inflammatory changes. Vascular/Lymphatic: No significant vascular findings are present. No enlarged abdominal or pelvic lymph nodes. Reproductive: Prostate is unremarkable. Other: No abdominal wall hernia or abnormality. No abdominopelvic ascites. Musculoskeletal: No acute or significant osseous findings. IMPRESSION: No acute abnormalities within the abdomen or pelvis. Electronically Signed   By: Ted Mcalpineobrinka  Dimitrova M.D.   On: 12/19/2021 09:55   US Abdomen Limited RUQ (LIVER/GB)  Result Date: 12/19/2021 CLINICAL DATA:  Right upper quadrant abdominal pain. EXAM: ULTRASOUND ABDOMEN LIMITED RIGHT UPPER QUADRANT COMPARISON:  May 12, 2020. FINDINGS: Gallbladder: No gallstones or wall thickening visualized. No sonographic Murphy sign noted by sonographer. Common bile duct: Diameter: 3 mm which is within normal limits. Liver: No focal lesion identified. Within normal limits in parenchymal echogenicity. Portal vein is patent on color Doppler imaging with normal direction of blood flow towards the liver. Other: None. IMPRESSION: No definite abnormality seen in the right upper quadrant of the abdomen. Electronically Signed   By: Lupita RaiderJames  Green Jr M.D.   On: 12/19/2021 08:13     Subjective: Patient seen examined bedside, resting comfortably.  Tolerating advance diet.  Ready for  discharge home.  Discussed need for complete cessation from marijuana as this is the etiology for his presenting symptoms of intractable nausea/vomiting.  No other specific complaints or concerns at this time.  Denies headache, no dizziness, no chest pain, no palpitations, no shortness of breath, no abdominal pain, no focal weakness, no fever/chills/night sweats, no nausea/vomiting/diarrhea, no focal weakness, no fatigue, no paresthesias.  No acute events overnight per nursing staff.  Discharge Exam: Vitals:   12/25/21 0500 12/25/21 1508  BP: (!) 146/88 (!) 146/88  Pulse: 72 72  Resp: 15 15  Temp: 98.5 F (36.9 C) 98.4 F (36.9 C)  SpO2: 100%    Vitals:   12/24/21 1415 12/24/21 2221 12/25/21 0500 12/25/21 1508  BP: (!) 147/92 (!) 145/101 (!) 146/88 (!) 146/88  Pulse: 75 82 72 72  Resp: 16 17 15 15   Temp: 98.7 F (37.1 C) 99.1 F (37.3 C) 98.5 F (36.9 C) 98.4 F (36.9 C)  TempSrc: Oral Oral Oral Oral  SpO2: 100% 97% 100%   Weight:    77.1 kg  Height:    5\' 10"  (1.778 m)    Physical Exam: GEN: NAD, alert and oriented x 3, wd/wn HEENT: NCAT, PERRL, EOMI, sclera clear, MMM PULM: CTAB w/o wheezes/crackles, normal respiratory effort CV: RRR w/o M/G/R GI: abd soft, NTND, NABS, no R/G/M MSK: no peripheral edema, muscle strength globally intact 5/5 bilateral upper/lower extremities NEURO: CN II-XII intact, no focal deficits, sensation to light touch intact  PSYCH: normal mood/affect Integumentary: dry/intact, no rashes or wounds    The results of significant diagnostics from this hospitalization (including imaging, microbiology, ancillary and laboratory) are listed below for reference.     Microbiology: Recent Results (from the past 240 hour(s))  Resp Panel by RT-PCR (Flu A&B, Covid) Anterior Nasal Swab     Status: None   Collection Time: 12/19/21  8:34 AM   Specimen: Anterior Nasal Swab  Result Value Ref Range Status   SARS Coronavirus 2 by RT PCR NEGATIVE NEGATIVE Final     Comment: (NOTE) SARS-CoV-2 target nucleic acids are NOT DETECTED.  The SARS-CoV-2 RNA is generally detectable in upper respiratory specimens during the acute phase of infection. The lowest concentration of SARS-CoV-2 viral copies this assay can detect is 138 copies/mL. A negative result does not preclude SARS-Cov-2 infection and should not be used as the sole basis for treatment or other patient management decisions. A negative result may occur with  improper specimen collection/handling, submission of specimen other than nasopharyngeal swab, presence of viral mutation(s) within the areas targeted by this assay, and inadequate number of viral copies(<138 copies/mL). A negative result must be combined with clinical observations, patient history, and epidemiological information. The expected result is Negative.  Fact Sheet for Patients:  EntrepreneurPulse.com.au  Fact Sheet for Healthcare Providers:  IncredibleEmployment.be  This test is no t yet approved or cleared by the [place] FDA and  has been authorized for detection and/or diagnosis of SARS-CoV-2 by FDA under an Emergency Use Authorization (EUA). This EUA will remain  in effect (meaning this test can be used) for the duration of the COVID-19 declaration under Section 564(b)(1) of the Act, 21 U.S.C.section 360bbb-3(b)(1), unless the authorization is terminated  or revoked sooner.       Influenza A by PCR NEGATIVE NEGATIVE Final   Influenza B by PCR NEGATIVE NEGATIVE Final    Comment: (NOTE) The Xpert Xpress SARS-CoV-2/FLU/RSV plus assay is intended as an aid in the diagnosis of influenza from Nasopharyngeal swab specimens and should not be used as a sole basis for treatment. Nasal washings and aspirates are unacceptable for Xpert Xpress SARS-CoV-2/FLU/RSV testing.  Fact Sheet for Patients: EntrepreneurPulse.com.au  Fact Sheet for Healthcare  Providers: IncredibleEmployment.be  This test is not yet approved or cleared by the [place] FDA and has been authorized for detection and/or diagnosis of SARS-CoV-2 by FDA under an Emergency Use Authorization (EUA). This EUA will remain in effect (meaning this test can be used) for the duration of the COVID-19 declaration under Section 564(b)(1) of the Act, 21 U.S.C. section 360bbb-3(b)(1), unless the authorization is terminated or revoked.  Performed at [facility], [place] [address], [place], [place] [address]   Resp Panel by RT-PCR (Flu A&B, Covid) Anterior Nasal Swab     Status: None   Collection Time: 12/22/21  5:09 PM   Specimen: Anterior Nasal Swab  Result Value Ref Range Status   SARS Coronavirus 2 by RT PCR NEGATIVE NEGATIVE Final    Comment: (NOTE) SARS-CoV-2 target nucleic acids are NOT DETECTED.  The SARS-CoV-2 RNA is generally detectable in upper respiratory specimens during the acute phase of infection. The lowest concentration of SARS-CoV-2 viral copies this assay can detect is 138 copies/mL. A negative result does not preclude SARS-Cov-2 infection and should not be used as the sole basis for treatment or other patient management decisions. A negative result may occur with  improper specimen collection/handling, submission of specimen other than nasopharyngeal swab, presence of  viral mutation(s) within the areas targeted by this assay, and inadequate number of viral copies(<138 copies/mL). A negative result must be combined with clinical observations, patient history, and epidemiological information. The expected result is Negative.  Fact Sheet for Patients:  BloggerCourse.com  Fact Sheet for Healthcare Providers:  SeriousBroker.it  This test is no t yet approved or cleared by the [place] FDA and  has been authorized for detection and/or diagnosis of SARS-CoV-2 by FDA under an  Emergency Use Authorization (EUA). This EUA will remain  in effect (meaning this test can be used) for the duration of the COVID-19 declaration under Section 564(b)(1) of the Act, 21 U.S.C.section 360bbb-3(b)(1), unless the authorization is terminated  or revoked sooner.       Influenza A by PCR NEGATIVE NEGATIVE Final   Influenza B by PCR NEGATIVE NEGATIVE Final    Comment: (NOTE) The Xpert Xpress SARS-CoV-2/FLU/RSV plus assay is intended as an aid in the diagnosis of influenza from Nasopharyngeal swab specimens and should not be used as a sole basis for treatment. Nasal washings and aspirates are unacceptable for Xpert Xpress SARS-CoV-2/FLU/RSV testing.  Fact Sheet for Patients: BloggerCourse.com  Fact Sheet for Healthcare Providers: SeriousBroker.it  This test is not yet approved or cleared by the [place] FDA and has been authorized for detection and/or diagnosis of SARS-CoV-2 by FDA under an Emergency Use Authorization (EUA). This EUA will remain in effect (meaning this test can be used) for the duration of the COVID-19 declaration under Section 564(b)(1) of the Act, 21 U.S.C. section 360bbb-3(b)(1), unless the authorization is terminated or revoked.  Performed at [facility], [address], [place], [place] [address]      Labs: BNP (last 3 results) No results for input(s): "BNP" in the last 8760 hours. Basic Metabolic Panel: Recent Labs  Lab 12/22/21 1625 12/22/21 2133 12/23/21 0558 12/24/21 0815 12/25/21 0504  NA 134* 135 136 136 134*  K 2.8* 2.9* 3.5 3.9 4.4  CL 90* 97* 100 102 104  CO2 21* 28 27 26 24   GLUCOSE 105* 100* 91 89 88  BUN 45* 31* 22* 16 14  CREATININE 1.17 0.85 0.74 0.81 0.76  CALCIUM 10.7* 8.7* 8.9 8.8* 8.8*  MG 2.5* 2.3 2.2 2.2 2.0   Liver Function Tests: Recent Labs  Lab 12/22/21 1625 12/22/21 2133 12/23/21 0558 12/23/21 0737 12/24/21 0815 12/25/21 0504   AST 38 37 34  --  46* 48*  ALT 59* 53* 55*  --  55* 49*  ALKPHOS 125 102 103  --  94 99  BILITOT 5.7* 4.7* 4.7* 4.7* 3.8* 2.8*  PROT 9.0* 7.2 7.0  --  6.9 7.2  ALBUMIN 5.4* 4.0 4.0  --  4.0 4.0   Recent Labs  Lab 12/19/21 0658 12/22/21 1017 12/22/21 1625  LIPASE 28 29 17    No results for input(s): "AMMONIA" in the last 168 hours. CBC: Recent Labs  Lab 12/19/21 0658 12/22/21 1017 12/22/21 1625 12/24/21 0815 12/25/21 0504  WBC 17.5* 10.7* 11.1* 7.3 7.7  NEUTROABS 15.6* 6.4 7.3  --   --   HGB 17.2* 19.2* 19.3* 15.3 14.9  HCT 49.6 52.9* 52.5* 46.8 43.9  MCV 89.2 84.1 84.7 94.7 90.1  PLT 277 345 312 201 239   Cardiac Enzymes: No results for input(s): "CKTOTAL", "CKMB", "CKMBINDEX", "TROPONINI" in the last 168 hours. BNP: Invalid input(s): "POCBNP" CBG: Recent Labs  Lab 12/22/21 1727  GLUCAP 103*   D-Dimer No results for input(s): "DDIMER" in the last 72 hours.  Hgb A1c No results for input(s): "HGBA1C" in the last 72 hours. Lipid Profile No results for input(s): "CHOL", "HDL", "LDLCALC", "TRIG", "CHOLHDL", "LDLDIRECT" in the last 72 hours. Thyroid function studies No results for input(s): "TSH", "T4TOTAL", "T3FREE", "THYROIDAB" in the last 72 hours.  Invalid input(s): "FREET3" Anemia work up No results for input(s): "VITAMINB12", "FOLATE", "FERRITIN", "TIBC", "IRON", "RETICCTPCT" in the last 72 hours. Urinalysis    Component Value Date/Time   COLORURINE YELLOW 12/22/2021 1600   APPEARANCEUR CLEAR 12/22/2021 1600   LABSPEC 1.035 (H) 12/22/2021 1600   PHURINE 6.0 12/22/2021 1600   GLUCOSEU NEGATIVE 12/22/2021 1600   HGBUR TRACE (A) 12/22/2021 1600   BILIRUBINUR SMALL (A) 12/22/2021 1600   KETONESUR 40 (A) 12/22/2021 1600   PROTEINUR >300 (A) 12/22/2021 1600   NITRITE NEGATIVE 12/22/2021 1600   LEUKOCYTESUR NEGATIVE 12/22/2021 1600   Sepsis Labs Recent Labs  Lab 12/22/21 1017 12/22/21 1625 12/24/21 0815 12/25/21 0504  WBC 10.7* 11.1* 7.3 7.7    Microbiology Recent Results (from the past 240 hour(s))  Resp Panel by RT-PCR (Flu A&B, Covid) Anterior Nasal Swab     Status: None   Collection Time: 12/19/21  8:34 AM   Specimen: Anterior Nasal Swab  Result Value Ref Range Status   SARS Coronavirus 2 by RT PCR NEGATIVE NEGATIVE Final    Comment: (NOTE) SARS-CoV-2 target nucleic acids are NOT DETECTED.  The SARS-CoV-2 RNA is generally detectable in upper respiratory specimens during the acute phase of infection. The lowest concentration of SARS-CoV-2 viral copies this assay can detect is 138 copies/mL. A negative result does not preclude SARS-Cov-2 infection and should not be used as the sole basis for treatment or other patient management decisions. A negative result may occur with  improper specimen collection/handling, submission of specimen other than nasopharyngeal swab, presence of viral mutation(s) within the areas targeted by this assay, and inadequate number of viral copies(<138 copies/mL). A negative result must be combined with clinical observations, patient history, and epidemiological information. The expected result is Negative.  Fact Sheet for Patients:  BloggerCourse.com  Fact Sheet for Healthcare Providers:  SeriousBroker.it  This test is no t yet approved or cleared by the [place] FDA and  has been authorized for detection and/or diagnosis of SARS-CoV-2 by FDA under an Emergency Use Authorization (EUA). This EUA will remain  in effect (meaning this test can be used) for the duration of the COVID-19 declaration under Section 564(b)(1) of the Act, 21 U.S.C.section 360bbb-3(b)(1), unless the authorization is terminated  or revoked sooner.       Influenza A by PCR NEGATIVE NEGATIVE Final   Influenza B by PCR NEGATIVE NEGATIVE Final    Comment: (NOTE) The Xpert Xpress SARS-CoV-2/FLU/RSV plus assay is intended as an aid in the diagnosis of influenza  from Nasopharyngeal swab specimens and should not be used as a sole basis for treatment. Nasal washings and aspirates are unacceptable for Xpert Xpress SARS-CoV-2/FLU/RSV testing.  Fact Sheet for Patients: BloggerCourse.com  Fact Sheet for Healthcare Providers: SeriousBroker.it  This test is not yet approved or cleared by the [place] FDA and has been authorized for detection and/or diagnosis of SARS-CoV-2 by FDA under an Emergency Use Authorization (EUA). This EUA will remain in effect (meaning this test can be used) for the duration of the COVID-19 declaration under Section 564(b)(1) of the Act, 21 U.S.C. section 360bbb-3(b)(1), unless the authorization is terminated or revoked.  Performed at [facility], [address] [place], [place] [address]  Resp Panel by RT-PCR (Flu A&B, Covid) Anterior Nasal Swab     Status: None   Collection Time: 12/22/21  5:09 PM   Specimen: Anterior Nasal Swab  Result Value Ref Range Status   SARS Coronavirus 2 by RT PCR NEGATIVE NEGATIVE Final    Comment: (NOTE) SARS-CoV-2 target nucleic acids are NOT DETECTED.  The SARS-CoV-2 RNA is generally detectable in upper respiratory specimens during the acute phase of infection. The lowest concentration of SARS-CoV-2 viral copies this assay can detect is 138 copies/mL. A negative result does not preclude SARS-Cov-2 infection and should not be used as the sole basis for treatment or other patient management decisions. A negative result may occur with  improper specimen collection/handling, submission of specimen other than nasopharyngeal swab, presence of viral mutation(s) within the areas targeted by this assay, and inadequate number of viral copies(<138 copies/mL). A negative result must be combined with clinical observations, patient history, and epidemiological information. The expected result is Negative.  Fact Sheet for Patients:   BloggerCourse.com  Fact Sheet for Healthcare Providers:  SeriousBroker.it  This test is no t yet approved or cleared by the [place] FDA and  has been authorized for detection and/or diagnosis of SARS-CoV-2 by FDA under an Emergency Use Authorization (EUA). This EUA will remain  in effect (meaning this test can be used) for the duration of the COVID-19 declaration under Section 564(b)(1) of the Act, 21 U.S.C.section 360bbb-3(b)(1), unless the authorization is terminated  or revoked sooner.       Influenza A by PCR NEGATIVE NEGATIVE Final   Influenza B by PCR NEGATIVE NEGATIVE Final    Comment: (NOTE) The Xpert Xpress SARS-CoV-2/FLU/RSV plus assay is intended as an aid in the diagnosis of influenza from Nasopharyngeal swab specimens and should not be used as a sole basis for treatment. Nasal washings and aspirates are unacceptable for Xpert Xpress SARS-CoV-2/FLU/RSV testing.  Fact Sheet for Patients: BloggerCourse.com  Fact Sheet for Healthcare Providers: SeriousBroker.it  This test is not yet approved or cleared by the [place] FDA and has been authorized for detection and/or diagnosis of SARS-CoV-2 by FDA under an Emergency Use Authorization (EUA). This EUA will remain in effect (meaning this test can be used) for the duration of the COVID-19 declaration under Section 564(b)(1) of the Act, 21 U.S.C. section 360bbb-3(b)(1), unless the authorization is terminated or revoked.  Performed at [facility], [address], [place], [place] [address]      Time coordinating discharge: Over 30 minutes  SIGNED:   Alvira Philips [place], DO  [facility] 12/25/2021, 5:56 PM

## 2021-12-25 NOTE — Progress Notes (Signed)
PROGRESS NOTE    Tom Jones  BHA:[id number] DOB: [birth year] DOA: 12/22/2021 PCP: Gildardo Pounds, NP    Brief Narrative:   Tom Jones is a 26 y.o. male with past medical history significant for marijuana abuse, history of cyclical vomiting syndrome with recurrent ED visits who presents to [facility] ED on 9/29 with continued nausea/vomiting and abdominal pain.  Was at [facility], ED earlier in the day but left before being seen.  Was also recently seen in [facility], ED on 9/26 for similar complaints and discharged home.  Patient reports he had initial nausea and vomiting related to eating spaghetti, stating he "overdid it" with the tomato sauce.  He woke up once again today with recurrence of symptoms, denies diarrhea or fevers.  Does endorse daily cannabinoid use.  In the ED, temperature 97.6 F, HR 100, RR 17, BP 132/107, SPO2 97% on room air.  WBC 10.7, hemoglobin 19.2, platelets 345.  Sodium 132, potassium 2.8, chloride 92, CO2 22, glucose 119, BUN 38, creatinine 1.53.  AST 46, ALT 60, total bilirubin 5.6.  Lipase 17.  COVID-19 PCR negative.  Influenza A/B PCR negative.  Urinalysis with negative leukocytes, negative nitrate, greater than 300 protein, rare bacteria 21-50 WBCs.  UDS positive for opiates, THC.  Ultrasound abdomen negative for gallstones, 2.7 cm echogenic area in the left hepatic lobe likely secondary to focal fat infiltration versus hemangioma, stable to slightly decreased compared to previous on 05/12/2020.  Recent CT scan abdomen/pelvis 9/26 with no acute intra-abdominal process.  EDP consulted [facility] for admission and further evaluation for persistent nausea/vomiting, hypokalemia, hyponatremia likely secondary to cannabinoid hyperemesis syndrome.  Assessment & Plan:   Intractable nausea/vomiting with abdominal pain Cannabinoid hyperemesis syndrome Patient presenting to the ED with recurrent nausea/vomiting and abdominal pain.  Multiple ED visits in the past for  similar occurrences.  Relates it to eating "spaghetti with tomato sauce".  But does endorse daily cannabinoid use.  Multiple work-ups with imaging studies unrevealing, no gallstones, no biliary ductal dilation or other intra-abdominal findings noted.  Suspect etiology likely secondary to cannabinoid hyperemesis syndrome.  Discussed complete cannabinoid cessation with patient and mother. --Advance to soft diet --IV fluid hydration --Protonix 40 mg IV Q12h -- Zofran as needed nausea/vomiting, QTc within normal limits on EKG this morning  Acute renal failure: Resolved Creatinine 1.53 on admission, likely secondary to prerenal azotemia in the setting of dehydration from nausea/vomiting and poor oral intake. --Cr 1.53>>0.81 --Continue IV fluid hydration  Hyponatremia: Resolved Sodium 134 on admission, likely secondary to GI loss in the setting of nausea/vomiting. --Na 132>136 --Continue IV fluid hydration  Hypokalemia Potassium 2.9 on admission, repleted with repeat potassium level 3.9 this morning.  Magnesium 2.2.  Etiology likely secondary to GI loss given nausea/vomiting. --Repeat electrolytes in a.m.   Prolonged QTc interval: Resolved QTc 520 on EKG on admission. repeat EKG today with QTc 421, now resolved.  Cholestasis Patient with chronically elevated bilirubin.  Unclear etiology, no biliary ductal dilation or cholelithiasis noted on imaging.  Total bilirubin 4.7, direct bilirubin 0.5, indirect 4.2. GGT elevated at 68.  Consideration for drug-induced liver injury with marijuana. --Tbili 5.7>>>2.8 --Outpatient follow-up with gastroenterology, will place ambulatory referral on discharge   DVT prophylaxis: SCDs Start: 12/23/21 0200    Code Status: Full Code Family Communication: No family present at bedside this morning.  Updated mother via telephone this morning  Disposition Plan:  Level of care: Med-Surg Status is: Inpatient Remains inpatient appropriate because: Needs toleration  of advance diet before stable for discharge home, anticipate possible discharge home tomorrow    Consultants:  None  Procedures:  None  Antimicrobials:  None   Subjective: Patient seen examined bedside, resting comfortably.  Lying in bed.  Continues to report emesis overnight, discussed will transition his antiemetic to Zofran given resolution of QTc prolongation on his EKG this morning.  Patient hopes will be able to discharge home tomorrow.  Discussed once again need complete cessation from marijuana as likely etiology of his presenting symptoms and likely concern for drug-induced liver injury which is all improving since he has been abstinent during the hospitalization.  No other questions or concerns at this time.  Updated patient's mother via telephone this morning.  Denies headache, no dizziness, no chest pain, no palpitations, no shortness of breath, no fever/chills/night sweats, no diarrhea, no focal weakness, no cough/congestion, no paresthesias.  No acute events overnight per nursing staff.  Objective: Vitals:   12/24/21 1415 12/24/21 2221 12/25/21 0500 12/25/21 1508  BP: (!) 147/92 (!) 145/101 (!) 146/88 (!) 146/88  Pulse: 75 82 72 72  Resp: 16 17 15 15   Temp: 98.7 F (37.1 C) 99.1 F (37.3 C) 98.5 F (36.9 C) 98.4 F (36.9 C)  TempSrc: Oral Oral Oral Oral  SpO2: 100% 97% 100%   Weight:    77.1 kg  Height:    5\' 10"  (1.778 m)    Intake/Output Summary (Last 24 hours) at 12/25/2021 1652 Last data filed at 12/25/2021 1100 Gross per 24 hour  Intake 100 ml  Output --  Net 100 ml   Filed Weights   12/25/21 1508  Weight: 77.1 kg    Examination:  Physical Exam: GEN: NAD, alert and oriented x 3, wd/wn HEENT: NCAT, PERRL, EOMI, sclera clear, MMM PULM: CTAB w/o wheezes/crackles, normal respiratory effort, on room air CV: RRR w/o M/G/R GI: abd soft, NTND, NABS, no R/G/M MSK: no peripheral edema, muscle strength globally intact 5/5 bilateral upper/lower  extremities NEURO: CN II-XII intact, no focal deficits, sensation to light touch intact PSYCH: normal mood/affect Integumentary: dry/intact, no rashes or wounds    Data Reviewed: I have personally reviewed following labs and imaging studies  CBC: Recent Labs  Lab 12/19/21 0658 12/22/21 1017 12/22/21 1625 12/24/21 0815 12/25/21 0504  WBC 17.5* 10.7* 11.1* 7.3 7.7  NEUTROABS 15.6* 6.4 7.3  --   --   HGB 17.2* 19.2* 19.3* 15.3 14.9  HCT 49.6 52.9* 52.5* 46.8 43.9  MCV 89.2 84.1 84.7 94.7 90.1  PLT 277 345 312 201 239   Basic Metabolic Panel: Recent Labs  Lab 12/22/21 1625 12/22/21 2133 12/23/21 0558 12/24/21 0815 12/25/21 0504  NA 134* 135 136 136 134*  K 2.8* 2.9* 3.5 3.9 4.4  CL 90* 97* 100 102 104  CO2 21* 28 27 26 24   GLUCOSE 105* 100* 91 89 88  BUN 45* 31* 22* 16 14  CREATININE 1.17 0.85 0.74 0.81 0.76  CALCIUM 10.7* 8.7* 8.9 8.8* 8.8*  MG 2.5* 2.3 2.2 2.2 2.0   GFR: Estimated Creatinine Clearance: 145.7 mL/min (by C-G formula based on SCr of 0.76 mg/dL). Liver Function Tests: Recent Labs  Lab 12/22/21 1625 12/22/21 2133 12/23/21 0558 12/23/21 0737 12/24/21 0815 12/25/21 0504  AST 38 37 34  --  46* 48*  ALT 59* 53* 55*  --  55* 49*  ALKPHOS 125 102 103  --  94 99  BILITOT 5.7* 4.7* 4.7* 4.7* 3.8* 2.8*  PROT 9.0* 7.2 7.0  --  6.9 7.2  ALBUMIN 5.4* 4.0 4.0  --  4.0 4.0   Recent Labs  Lab 12/19/21 0658 12/22/21 1017 12/22/21 1625  LIPASE 28 29 17    No results for input(s): "AMMONIA" in the last 168 hours. Coagulation Profile: No results for input(s): "INR", "PROTIME" in the last 168 hours. Cardiac Enzymes: No results for input(s): "CKTOTAL", "CKMB", "CKMBINDEX", "TROPONINI" in the last 168 hours. BNP (last 3 results) No results for input(s): "PROBNP" in the last 8760 hours. HbA1C: No results for input(s): "HGBA1C" in the last 72 hours. CBG: Recent Labs  Lab 12/22/21 1727  GLUCAP 103*   Lipid Profile: No results for input(s): "CHOL",  "HDL", "LDLCALC", "TRIG", "CHOLHDL", "LDLDIRECT" in the last 72 hours. Thyroid Function Tests: No results for input(s): "TSH", "T4TOTAL", "FREET4", "T3FREE", "THYROIDAB" in the last 72 hours. Anemia Panel: No results for input(s): "VITAMINB12", "FOLATE", "FERRITIN", "TIBC", "IRON", "RETICCTPCT" in the last 72 hours. Sepsis Labs: Recent Labs  Lab 12/19/21 1027  LATICACIDVEN 2.0*    Recent Results (from the past 240 hour(s))  Resp Panel by RT-PCR (Flu A&B, Covid) Anterior Nasal Swab     Status: None   Collection Time: 12/19/21  8:34 AM   Specimen: Anterior Nasal Swab  Result Value Ref Range Status   SARS Coronavirus 2 by RT PCR NEGATIVE NEGATIVE Final    Comment: (NOTE) SARS-CoV-2 target nucleic acids are NOT DETECTED.  The SARS-CoV-2 RNA is generally detectable in upper respiratory specimens during the acute phase of infection. The lowest concentration of SARS-CoV-2 viral copies this assay can detect is 138 copies/mL. A negative result does not preclude SARS-Cov-2 infection and should not be used as the sole basis for treatment or other patient management decisions. A negative result may occur with  improper specimen collection/handling, submission of specimen other than nasopharyngeal swab, presence of viral mutation(s) within the areas targeted by this assay, and inadequate number of viral copies(<138 copies/mL). A negative result must be combined with clinical observations, patient history, and epidemiological information. The expected result is Negative.  Fact Sheet for Patients:  12/21/21  Fact Sheet for Healthcare Providers:  BloggerCourse.com  This test is no t yet approved or cleared by the [place] FDA and  has been authorized for detection and/or diagnosis of SARS-CoV-2 by FDA under an Emergency Use Authorization (EUA). This EUA will remain  in effect (meaning this test can be used) for the duration of  the COVID-19 declaration under Section 564(b)(1) of the Act, 21 U.S.C.section 360bbb-3(b)(1), unless the authorization is terminated  or revoked sooner.       Influenza A by PCR NEGATIVE NEGATIVE Final   Influenza B by PCR NEGATIVE NEGATIVE Final    Comment: (NOTE) The Xpert Xpress SARS-CoV-2/FLU/RSV plus assay is intended as an aid in the diagnosis of influenza from Nasopharyngeal swab specimens and should not be used as a sole basis for treatment. Nasal washings and aspirates are unacceptable for Xpert Xpress SARS-CoV-2/FLU/RSV testing.  Fact Sheet for Patients: [place]  Fact Sheet for Healthcare Providers: BloggerCourse.com  This test is not yet approved or cleared by the [place] FDA and has been authorized for detection and/or diagnosis of SARS-CoV-2 by FDA under an Emergency Use Authorization (EUA). This EUA will remain in effect (meaning this test can be used) for the duration of the COVID-19 declaration under Section 564(b)(1) of the Act, 21 U.S.C. section 360bbb-3(b)(1), unless the authorization is terminated or revoked.  Performed at [facility], [address], [place], [place]  Resp Panel by RT-PCR (Flu A&B, Covid) Anterior Nasal Swab     Status: None   Collection Time: 12/22/21  5:09 PM   Specimen: Anterior Nasal Swab  Result Value Ref Range Status   SARS Coronavirus 2 by RT PCR NEGATIVE NEGATIVE Final    Comment: (NOTE) SARS-CoV-2 target nucleic acids are NOT DETECTED.  The SARS-CoV-2 RNA is generally detectable in upper respiratory specimens during the acute phase of infection. The lowest concentration of SARS-CoV-2 viral copies this assay can detect is 138 copies/mL. A negative result does not preclude SARS-Cov-2 infection and should not be used as the sole basis for treatment or other patient management decisions. A negative result may occur with  improper specimen  collection/handling, submission of specimen other than nasopharyngeal swab, presence of viral mutation(s) within the areas targeted by this assay, and inadequate number of viral copies(<138 copies/mL). A negative result must be combined with clinical observations, patient history, and epidemiological information. The expected result is Negative.  Fact Sheet for Patients:  BloggerCourse.com  Fact Sheet for Healthcare Providers:  [employer]iousBroker.it  This test is no t yet approved or cleared by the [place] FDA and  has been authorized for detection and/or diagnosis of SARS-CoV-2 by FDA under an Emergency Use Authorization (EUA). This EUA will remain  in effect (meaning this test can be used) for the duration of the COVID-19 declaration under Section 564(b)(1) of the Act, 21 U.S.C.section 360bbb-3(b)(1), unless the authorization is terminated  or revoked sooner.       Influenza A by PCR NEGATIVE NEGATIVE Final   Influenza B by PCR NEGATIVE NEGATIVE Final    Comment: (NOTE) The Xpert Xpress SARS-CoV-2/FLU/RSV plus assay is intended as an aid in the diagnosis of influenza from Nasopharyngeal swab specimens and should not be used as a sole basis for treatment. Nasal washings and aspirates are unacceptable for Xpert Xpress SARS-CoV-2/FLU/RSV testing.  Fact Sheet for Patients: BloggerCourse.com  Fact Sheet for Healthcare Providers: SeriousBroker.it  This test is not yet approved or cleared by the [place] FDA and has been authorized for detection and/or diagnosis of SARS-CoV-2 by FDA under an Emergency Use Authorization (EUA). This EUA will remain in effect (meaning this test can be used) for the duration of the COVID-19 declaration under Section 564(b)(1) of the Act, 21 U.S.C. section 360bbb-3(b)(1), unless the authorization is terminated or revoked.  Performed at [facility], [address], [place], [place] [address]          Radiology Studies: No results found.      Scheduled Meds:  feeding supplement  1 Container Oral TID BM   Continuous Infusions:     LOS: 3 days    Time spent: 50 minutes spent on chart review, discussion with nursing staff, consultants, updating family and interview/physical exam; more than 50% of that time was spent in counseling and/or coordination of care.    Alvira Philips [place], DO [facility] Available via Epic secure chat 7am-7pm After these hours, please refer to coverage provider listed on [email or web address] 12/25/2021, 4:52 PM

## 2021-12-26 ENCOUNTER — Telehealth: Payer: Self-pay

## 2021-12-26 NOTE — Telephone Encounter (Signed)
Transition Care Management Unsuccessful Follow-up Telephone Call  Date of discharge and from where:  12/25/2021, [facility]  Attempts:  1st Attempt  Reason for unsuccessful TCM follow-up call:  Unable to leave message - I called [phone] and the phone just rings, no option for voicemail.  Geryl Rankins, NP is listed as PCP but the patient has never seen her.

## 2021-12-27 ENCOUNTER — Telehealth: Payer: Self-pay

## 2021-12-27 NOTE — Telephone Encounter (Signed)
Transition Care Management Unsuccessful Follow-up Telephone Call  Date of discharge and from where:  12/25/2021, [facility]   Attempts:  2nd Attempt  Reason for unsuccessful TCM follow-up call:  Unable to reach patient  I called [phone] and the recording stated that the call cannot be completed at this time.    Geryl Rankins, NP is listed as PCP but the patient has never seen her.

## 2021-12-28 ENCOUNTER — Telehealth: Payer: Self-pay

## 2021-12-28 NOTE — Telephone Encounter (Signed)
Transition Care Management Unsuccessful Follow-up Telephone Call  Date of discharge and from where:  12/25/2021, [facility]  Attempts:  3rd Attempt  Reason for unsuccessful TCM follow-up call:  Unable to reach patient- I called [phone] and the recording stated that the call cannot be completed at this time.   Letter sent to patient requesting he contact [place] to schedule a follow up appointment as we have not been able to reach him.   Geryl Rankins, NP is listed as PCP but the patient has never seen her.

## 2022-01-29 ENCOUNTER — Encounter: Payer: Self-pay | Admitting: Gastroenterology

## 2022-02-22 ENCOUNTER — Ambulatory Visit: Payer: Self-pay | Admitting: Gastroenterology

## 2022-11-14 ENCOUNTER — Encounter (HOSPITAL_BASED_OUTPATIENT_CLINIC_OR_DEPARTMENT_OTHER): Payer: Self-pay | Admitting: *Deleted

## 2022-11-14 ENCOUNTER — Other Ambulatory Visit: Payer: Self-pay

## 2022-11-14 ENCOUNTER — Emergency Department (HOSPITAL_BASED_OUTPATIENT_CLINIC_OR_DEPARTMENT_OTHER)
Admission: EM | Admit: 2022-11-14 | Discharge: 2022-11-14 | Disposition: A | Discharge status: Home / Self Care | Payer: Self-pay | Attending: Emergency Medicine | Admitting: Emergency Medicine

## 2022-11-14 DIAGNOSIS — K0889 Other specified disorders of teeth and supporting structures: Secondary | ICD-10-CM | POA: Insufficient documentation

## 2022-11-14 MED ORDER — AMOXICILLIN-POT CLAVULANATE 875-125 MG PO TABS: 1.0000 | ORAL_TABLET | Freq: Two times a day (BID) | ORAL | 0 refills | Status: DC

## 2022-11-14 MED ORDER — NAPROXEN 500 MG PO TABS: 500.0000 mg | ORAL_TABLET | Freq: Two times a day (BID) | ORAL | 0 refills | Status: DC

## 2022-11-14 MED ORDER — AMOXICILLIN-POT CLAVULANATE 875-125 MG PO TABS
1.0000 | ORAL_TABLET | Freq: Once | ORAL | Status: AC
  Administered 2022-11-14: 1 via ORAL
  Filled 2022-11-14: qty 1

## 2022-11-14 MED ORDER — NAPROXEN 250 MG PO TABS
500.0000 mg | ORAL_TABLET | Freq: Once | ORAL | Status: AC
  Administered 2022-11-14: 500 mg via ORAL
  Filled 2022-11-14: qty 2

## 2022-11-14 NOTE — ED Triage Notes (Signed)
Pt arrived with R sided dental day for the past day, unrelieved with OTC medications

## 2022-11-14 NOTE — ED Provider Notes (Signed)
   [place] EMERGENCY DEPARTMENT AT [facility]  Provider Note  CSN: [id number] Arrival date & time: 11/14/22 0510  History Chief Complaint  Patient presents with   Dental Pain    Tom Jones is a 27 y.o. male with no significant PMH reports pain in tooth #31 off and on for a couple of years but worse in the last few days. No fever or drainage. Took some left over Abx at home yesterday without improvement. Does not have a dentist.    Home Medications Prior to Admission medications   Medication Sig Start Date End Date Taking? Authorizing Provider  ondansetron (ZOFRAN) 4 MG tablet Take 1 tablet (4 mg total) by mouth every 6 (six) hours as needed for nausea. 12/25/21   [place], Eric J, DO  pantoprazole (PROTONIX) 40 MG tablet Take 1 tablet (40 mg total) by mouth daily. 05/06/20 06/05/20  Glade Lloyd, MD     Allergies    Patient has no known allergies.   Review of Systems   Review of Systems Please see HPI for pertinent positives and negatives  Physical Exam BP (!) 145/87   Pulse (!) 58   Temp 98.9 F (37.2 C)   Resp 16   SpO2 99%   Physical Exam Vitals and nursing note reviewed.  HENT:     Head: Normocephalic.     Nose: Nose normal.     Mouth/Throat:     Comments: Dental caries to tooth #31 with tenderness but no adjacent fluctuance or signs of deep infection Eyes:     Extraocular Movements: Extraocular movements intact.  Pulmonary:     Effort: Pulmonary effort is normal.  Musculoskeletal:        General: Normal range of motion.     Cervical back: Neck supple.  Skin:    Findings: No rash (on exposed skin).  Neurological:     Mental Status: He is alert and oriented to person, place, and time.  Psychiatric:        Mood and Affect: Mood normal.     ED Results / Procedures / Treatments   EKG None  Procedures Procedures  Medications Ordered in the ED Medications  amoxicillin-clavulanate (AUGMENTIN) 875-125 MG per tablet 1 tablet (has no  administration in time range)  naproxen (NAPROSYN) tablet 500 mg (has no administration in time range)    Initial Impression and Plan  Patient here with recurrent tooth ache. Advised he will need to see a dentist. Augmentin and Naprosyn Rx sent.   ED Course       MDM Rules/Calculators/A&P Medical Decision Making Problems Addressed: Toothache: acute illness or injury  Risk Prescription drug management.     Final Clinical Impression(s) / ED Diagnoses Final diagnoses:  Toothache    Rx / DC Orders ED Discharge Orders     None        Pollyann Savoy, MD 11/14/22 [phone]

## 2023-02-12 ENCOUNTER — Encounter (HOSPITAL_BASED_OUTPATIENT_CLINIC_OR_DEPARTMENT_OTHER): Payer: Self-pay | Admitting: *Deleted

## 2023-02-12 ENCOUNTER — Other Ambulatory Visit: Payer: Self-pay

## 2023-02-12 ENCOUNTER — Emergency Department (HOSPITAL_BASED_OUTPATIENT_CLINIC_OR_DEPARTMENT_OTHER)
Admission: EM | Admit: 2023-02-12 | Discharge: 2023-02-12 | Disposition: A | Discharge status: Home / Self Care | Payer: Self-pay | Attending: Emergency Medicine | Admitting: Emergency Medicine

## 2023-02-12 DIAGNOSIS — K0889 Other specified disorders of teeth and supporting structures: Secondary | ICD-10-CM | POA: Insufficient documentation

## 2023-02-12 DIAGNOSIS — G8929 Other chronic pain: Secondary | ICD-10-CM | POA: Insufficient documentation

## 2023-02-12 MED ORDER — BUPIVACAINE HCL 0.5 % IJ SOLN
20.0000 mL | Freq: Once | INTRAMUSCULAR | Status: AC
  Administered 2023-02-12: 20 mL
  Filled 2023-02-12: qty 1

## 2023-02-12 MED ORDER — AMOXICILLIN-POT CLAVULANATE 875-125 MG PO TABS: 1.0000 | ORAL_TABLET | Freq: Two times a day (BID) | ORAL | 0 refills | Status: DC

## 2023-02-12 NOTE — ED Triage Notes (Signed)
Right lower dental pain "since June" that woke him up from his sleep tonight. Has been taking ibuprofen for pain

## 2023-02-12 NOTE — Discharge Instructions (Addendum)
Your dental pain was treated with a nerve block, you have been prescribed antibiotics to prevent infection.  Follow-up outpatient with a dentist

## 2023-02-12 NOTE — ED Provider Notes (Signed)
[place] EMERGENCY DEPARTMENT AT [facility] Provider Note   CSN: [id number] Arrival date & time: 02/12/23  0232     History  Chief Complaint  Patient presents with   Dental Pain    Tom Jones is a 27 y.o. male.   Dental Pain    27 year old male senting to the Emergency Department with chronic dental pain.  The patient states that he has had pain since June.  He has pain in his lower molar from a cracked tooth.  He endorses sensitivity to hot and cold.  Pain radiates up to his face.  No fevers or chills.  No swelling in the mouth.  Home Medications Prior to Admission medications   Medication Sig Start Date End Date Taking? Authorizing Provider  amoxicillin-clavulanate (AUGMENTIN) 875-125 MG tablet Take 1 tablet by mouth every 12 (twelve) hours. 02/12/23  Yes Ernie Avena, MD  naproxen (NAPROSYN) 500 MG tablet Take 1 tablet (500 mg total) by mouth 2 (two) times daily. 11/14/22   Pollyann Savoy, MD  ondansetron (ZOFRAN) 4 MG tablet Take 1 tablet (4 mg total) by mouth every 6 (six) hours as needed for nausea. 12/25/21   [place], Eric J, DO  pantoprazole (PROTONIX) 40 MG tablet Take 1 tablet (40 mg total) by mouth daily. 05/06/20 06/05/20  Glade Lloyd, MD      Allergies    Patient has no known allergies.    Review of Systems   Review of Systems  All other systems reviewed and are negative.   Physical Exam Updated Vital Signs BP (!) 134/92 (BP Location: Right Arm)   Pulse 80   Temp 98.3 F (36.8 C) (Oral)   Resp 18   SpO2 98%  Physical Exam Vitals and nursing note reviewed.  Constitutional:      General: He is not in acute distress. HENT:     Head: Normocephalic and atraumatic.     Mouth/Throat:      Comments: Cracked posterior molar on the right, no evidence of abscess, poor dentition throughout Eyes:     Conjunctiva/sclera: Conjunctivae normal.     Pupils: Pupils are equal, round, and reactive to light.  Cardiovascular:     Rate and  Rhythm: Normal rate and regular rhythm.  Pulmonary:     Effort: Pulmonary effort is normal. No respiratory distress.  Abdominal:     General: There is no distension.     Tenderness: There is no guarding.  Musculoskeletal:        General: No deformity or signs of injury.     Cervical back: Neck supple.  Skin:    Findings: No lesion or rash.  Neurological:     General: No focal deficit present.     Mental Status: He is alert. Mental status is at baseline.     ED Results / Procedures / Treatments   Labs (all labs ordered are listed, but only abnormal results are displayed) Labs Reviewed - No data to display  EKG None  Radiology No results found.  Procedures .Nerve Block  Date/Time: 02/12/2023 5:25 AM  Performed by: Ernie Avena, MD Authorized by: Ernie Avena, MD   Consent:    Consent obtained:  Verbal   Consent given by:  Patient   Risks discussed:  Infection, pain, swelling, bleeding and unsuccessful block   Alternatives discussed:  No treatment Universal protocol:    Patient identity confirmed:  Verbally with patient Indications:    Indications:  Pain relief Location:    Nerve block  body site: Inferior alveolar. Pre-procedure details:    Preparation: Patient was prepped and draped in usual sterile fashion   Procedure details:    Block needle gauge:  25 G   Anesthetic injected:  Bupivacaine 0.25% w/o epi   Steroid injected:  None   Injection procedure:  Anatomic landmarks identified, incremental injection, negative aspiration for blood, introduced needle and anatomic landmarks palpated Post-procedure details:    Dressing:  None   Outcome:  Anesthesia achieved   Procedure completion:  Tolerated     Medications Ordered in ED Medications  bupivacaine (MARCAINE) 0.5 % (with pres) injection 20 mL (20 mLs Infiltration Given 02/12/23 0502)    ED Course/ Medical Decision Making/ A&P                                 Medical Decision  Making Risk Prescription drug management.     27 year old male senting to the Emergency Department with chronic dental pain.  The patient states that he has had pain since June.  He has pain in his lower molar from a cracked tooth.  He endorses sensitivity to hot and cold.  Pain radiates up to his face.  No fevers or chills.  No swelling in the mouth.  On arrival, the patient was vitally stable, physical exam revealed a cracked posterior molar on the right, no evidence of odontogenic abscess, poor dentition noted throughout.  Patient was offered a dental block which was performed as per the procedure note above.  Symptoms are chronic, low concern for dental infection at this time, recommend that he follow-up outpatient with a dentist.    Final Clinical Impression(s) / ED Diagnoses Final diagnoses:  Chronic dental pain    Rx / DC Orders ED Discharge Orders          Ordered    amoxicillin-clavulanate (AUGMENTIN) 875-125 MG tablet  Every 12 hours        02/12/23 0524              Ernie Avena, MD 02/12/23 [phone]

## 2024-01-09 ENCOUNTER — Encounter: Payer: Self-pay | Admitting: Emergency Medicine

## 2024-01-09 ENCOUNTER — Ambulatory Visit
Admission: EM | Admit: 2024-01-09 | Discharge: 2024-01-09 | Disposition: A | Discharge status: Home / Self Care | Attending: Family Medicine | Admitting: Family Medicine

## 2024-01-09 DIAGNOSIS — Z202 Contact with and (suspected) exposure to infections with a predominantly sexual mode of transmission: Secondary | ICD-10-CM | POA: Diagnosis present

## 2024-01-09 MED ORDER — METRONIDAZOLE 500 MG PO TABS: 2000.0000 mg | ORAL_TABLET | Freq: Once | ORAL | 0 refills | Status: AC

## 2024-01-09 NOTE — ED Provider Notes (Addendum)
 [name]    CSN: [id number] Arrival date & time: 01/09/24  1729      History   Chief Complaint Chief Complaint  Patient presents with   SEXUALLY TRANSMITTED DISEASE    HPI [facility] is a 28 y.o. male.   HPI Here for exposure to trichomonas.  He does not have any penile discharge or itching or dysuria.  NKDA   Past Medical History:  Diagnosis Date   Closed fibular fracture 05/07/2012   right distal fib. - sledding accident   Seizures [facility]) age 73   febrile seizure x 1    Patient Active Problem List   Diagnosis Date Noted   Cholestasis (HCC) 12/22/2021   Protein-calorie malnutrition, severe 05/11/2020   Intractable nausea and vomiting 05/10/2020   Marijuana abuse 05/10/2020   Hypokalemia 05/10/2020   Prolonged QT interval 05/10/2020   Transaminitis 05/10/2020   Hyperbilirubinemia 05/10/2020   AKI (acute kidney injury) 05/05/2020   Abdominal pain 05/05/2020   History of marijuana use 11/04/2018   Intractable vomiting 11/04/2018    Past Surgical History:  Procedure Laterality Date   BIOPSY  05/11/2020   Procedure: BIOPSY;  Surgeon: Aneita Gwendlyn DASEN, MD;  Location: [facility];  Service: Endoscopy;;   ESOPHAGOGASTRODUODENOSCOPY (EGD) WITH PROPOFOL  N/A 05/11/2020   Procedure: ESOPHAGOGASTRODUODENOSCOPY (EGD) WITH PROPOFOL ;  Surgeon: Aneita Gwendlyn DASEN, MD;  [facility];  Service: Endoscopy;  Laterality: N/A;   ORIF FIBULA FRACTURE Right 05/19/2012   Procedure: OPEN REDUCTION INTERNAL FIXATION (ORIF) FIBULA FRACTURE;  Surgeon: Lamar DELENA Millman, MD;  Location: [facility];  Service: Orthopedics;  Laterality: Right;       Home Medications    Prior to Admission medications   Medication Sig Start Date End Date Taking? Authorizing Provider  metroNIDAZOLE (FLAGYL) 500 MG tablet Take 4 tablets (2,000 mg total) by mouth once for 1 dose. 01/09/24 01/09/24 Yes Vonna Sharlet POUR, MD    Family History Family History  Problem  Relation Age of Onset   Diabetes Maternal Aunt        2 aunts   Hypertension Maternal Aunt    Heart disease Maternal Aunt        valve replacement   Diabetes Maternal Grandmother     Social History Social History   Tobacco Use   Smoking status: Some Days    Types: E-cigarettes, Cigars   Smokeless tobacco: Never   Tobacco comments:    mother smokes outside  Vaping Use   Vaping status: Some Days  Substance Use Topics   Alcohol use: Yes   Drug use: Not Currently    Types: Marijuana     Allergies   Patient has no known allergies.   Review of Systems Review of Systems   Physical Exam Triage Vital Signs ED Triage Vitals [01/09/24 1815]  Encounter Vitals Group     BP 117/72     Girls Systolic BP Percentile      Girls Diastolic BP Percentile      Boys Systolic BP Percentile      Boys Diastolic BP Percentile      Pulse Rate 74     Resp 14     Temp 98.4 F (36.9 C)     Temp Source Oral     SpO2 97 %     Weight      Height      Head Circumference      Peak Flow      Pain Score 0  Pain Loc      Pain Education      Exclude from Growth Chart    No data found.  Updated Vital Signs BP 117/72 (BP Location: Left Arm)   Pulse 74   Temp 98.4 F (36.9 C) (Oral)   Resp 14   SpO2 97%   Visual Acuity Right Eye Distance:   Left Eye Distance:   Bilateral Distance:    Right Eye Near:   Left Eye Near:    Bilateral Near:     Physical Exam Vitals reviewed.  Constitutional:      General: He is not in acute distress.    Appearance: He is not ill-appearing, toxic-appearing or diaphoretic.  Skin:    Coloration: Skin is not pale.  Neurological:     Mental Status: He is alert and oriented to person, place, and time.  Psychiatric:        Behavior: Behavior normal.      UC Treatments / Results  Labs (all labs ordered are listed, but only abnormal results are displayed) Labs Reviewed  HIV ANTIBODY (ROUTINE TESTING W REFLEX)  RPR  CYTOLOGY, (ORAL, ANAL,  URETHRAL) ANCILLARY ONLY    EKG   Radiology No results found.  Procedures Procedures (including critical care time)  Medications Ordered in UC Medications - No data to display  Initial Impression / Assessment and Plan / UC Course  I have reviewed the triage vital signs and the nursing notes.  Pertinent labs & imaging results that were available during my care of the patient were reviewed by me and considered in my medical decision making (see chart for details).     Blood is drawn for HIV and RPR, and staff will notify them if any of that is positive   Urethral self swab is done and staff will notify him of any positives and treat per protocol.  We discussed options and he wanted to be treated empirically though he does not have symptoms.  Metronidazole is sent to the pharmacy to treat with a one-time dose. Final Clinical Impressions(s) / UC Diagnoses   Final diagnoses:  Exposure to STD     Discharge Instructions      Staff will notify you if there is anything positive on the swab or on the blood work. It can take 2-3 days for the tests to result, depending on the day of the week your test was taken. You will only be notified if there are any positives on the testing; test results will also go to your MyChart if you are signed up for MyChart.   Take metronidazole 500 mg-- 4 tablets taken together 1 time.  Take it after a good-sized meal.  Avoid drinking alcohol within 72 hours of taking this medication        ED Prescriptions     Medication Sig Dispense Auth. Provider   metroNIDAZOLE (FLAGYL) 500 MG tablet Take 4 tablets (2,000 mg total) by mouth once for 1 dose. 4 tablet Roxi Hlavaty, Sharlet POUR, MD      PDMP not reviewed this encounter.   Vonna Sharlet POUR, MD 01/09/24 SHARLENE    Vonna Sharlet POUR, MD 01/09/24 [phone]

## 2024-01-09 NOTE — ED Triage Notes (Signed)
 Pt reports known exposure to trichomoniasis x2 weeks ago from his his partner. Pt is asymptomatic. Here for testing/treatment.

## 2024-01-09 NOTE — Discharge Instructions (Addendum)
 Staff will notify you if there is anything positive on the swab or on the blood work. It can take 2-3 days for the tests to result, depending on the day of the week your test was taken. You will only be notified if there are any positives on the testing; test results will also go to your MyChart if you are signed up for MyChart.   Take metronidazole 500 mg-- 4 tablets taken together 1 time.  Take it after a good-sized meal.  Avoid drinking alcohol within 72 hours of taking this medication

## 2024-01-10 ENCOUNTER — Ambulatory Visit (HOSPITAL_COMMUNITY): Payer: Self-pay

## 2024-01-10 LAB — CYTOLOGY, (ORAL, ANAL, URETHRAL) ANCILLARY ONLY
Chlamydia: NEGATIVE
Comment: NEGATIVE
Comment: NEGATIVE
Comment: NORMAL
Neisseria Gonorrhea: NEGATIVE
Trichomonas: POSITIVE — AB

## 2024-01-10 LAB — HIV ANTIBODY (ROUTINE TESTING W REFLEX): HIV Screen 4th Generation wRfx: NONREACTIVE

## 2024-01-10 LAB — RPR: RPR Ser Ql: NONREACTIVE
